# Patient Record
Sex: Female | Born: 1935 | Race: White | Hispanic: No | Smoking: Never smoker
Health system: Southern US, Community
[De-identification: ages and names within clinical notes are randomized; demographics above are authoritative.]

## PROBLEM LIST (undated history)

## (undated) DIAGNOSIS — I4891 Unspecified atrial fibrillation: Secondary | ICD-10-CM

## (undated) DIAGNOSIS — G309 Alzheimer's disease, unspecified: Secondary | ICD-10-CM

## (undated) DIAGNOSIS — I10 Essential (primary) hypertension: Secondary | ICD-10-CM

## (undated) DIAGNOSIS — F0280 Dementia in other diseases classified elsewhere without behavioral disturbance: Secondary | ICD-10-CM

## (undated) HISTORY — PX: CHOLECYSTECTOMY: SHX55

## (undated) HISTORY — PX: ABDOMINAL HYSTERECTOMY: SHX81

## (undated) HISTORY — PX: HIP FRACTURE SURGERY: SHX118

---

## 2004-03-23 ENCOUNTER — Ambulatory Visit: Payer: Self-pay | Admitting: Unknown Physician Specialty

## 2004-04-02 ENCOUNTER — Emergency Department: Payer: Self-pay | Admitting: Unknown Physician Specialty

## 2004-11-30 ENCOUNTER — Ambulatory Visit: Payer: Self-pay | Admitting: Ophthalmology

## 2006-06-15 ENCOUNTER — Ambulatory Visit: Payer: Self-pay | Admitting: Unknown Physician Specialty

## 2007-08-08 ENCOUNTER — Ambulatory Visit: Payer: Self-pay | Admitting: Unknown Physician Specialty

## 2007-09-24 ENCOUNTER — Ambulatory Visit: Payer: Self-pay | Admitting: Unknown Physician Specialty

## 2007-11-12 ENCOUNTER — Ambulatory Visit: Payer: Self-pay | Admitting: Unknown Physician Specialty

## 2007-12-28 ENCOUNTER — Ambulatory Visit: Payer: Self-pay | Admitting: Urology

## 2008-01-02 ENCOUNTER — Ambulatory Visit: Payer: Self-pay | Admitting: Urology

## 2008-05-01 ENCOUNTER — Ambulatory Visit: Payer: Self-pay | Admitting: Urology

## 2008-09-10 ENCOUNTER — Ambulatory Visit: Payer: Self-pay | Admitting: Urology

## 2009-07-13 ENCOUNTER — Ambulatory Visit: Payer: Self-pay | Admitting: Internal Medicine

## 2010-10-01 ENCOUNTER — Inpatient Hospital Stay: Payer: Self-pay | Admitting: Student

## 2010-10-07 LAB — PATHOLOGY REPORT

## 2010-11-03 ENCOUNTER — Ambulatory Visit: Payer: Self-pay | Admitting: Pain Medicine

## 2010-11-09 ENCOUNTER — Inpatient Hospital Stay: Payer: Self-pay | Admitting: *Deleted

## 2010-11-12 LAB — PATHOLOGY REPORT

## 2010-11-22 ENCOUNTER — Ambulatory Visit: Payer: Self-pay | Admitting: Pain Medicine

## 2010-11-30 ENCOUNTER — Ambulatory Visit: Payer: Self-pay | Admitting: Pain Medicine

## 2010-12-02 ENCOUNTER — Ambulatory Visit: Payer: Self-pay | Admitting: Pain Medicine

## 2010-12-15 ENCOUNTER — Ambulatory Visit: Payer: Self-pay | Admitting: Pain Medicine

## 2010-12-16 ENCOUNTER — Ambulatory Visit: Payer: Self-pay | Admitting: Pain Medicine

## 2011-01-03 ENCOUNTER — Ambulatory Visit: Payer: Self-pay | Admitting: Pain Medicine

## 2011-01-17 ENCOUNTER — Emergency Department: Payer: Self-pay | Admitting: Emergency Medicine

## 2011-03-13 ENCOUNTER — Emergency Department: Payer: Self-pay | Admitting: Emergency Medicine

## 2011-03-13 LAB — URINALYSIS, COMPLETE
Bacteria: NONE SEEN
Bilirubin,UR: NEGATIVE
Glucose,UR: NEGATIVE mg/dL (ref 0–75)
Ketone: NEGATIVE
Protein: NEGATIVE
RBC,UR: 1 /HPF (ref 0–5)
Specific Gravity: 1.01 (ref 1.003–1.030)
WBC UR: 1 /HPF (ref 0–5)

## 2011-03-13 LAB — COMPREHENSIVE METABOLIC PANEL
Anion Gap: 8 (ref 7–16)
BUN: 11 mg/dL (ref 7–18)
Bilirubin,Total: 0.6 mg/dL (ref 0.2–1.0)
Calcium, Total: 9.2 mg/dL (ref 8.5–10.1)
Chloride: 98 mmol/L (ref 98–107)
Co2: 32 mmol/L (ref 21–32)
EGFR (African American): >60
EGFR (Non-African Amer.): >60
Osmolality: 275 (ref 275–301)
SGOT(AST): 21 U/L (ref 15–37)
SGPT (ALT): 14 U/L
Total Protein: 7.1 g/dL (ref 6.4–8.2)

## 2011-03-13 LAB — CBC
HCT: 37.7 % (ref 35.0–47.0)
MCHC: 34 g/dL (ref 32.0–36.0)
Platelet: 216 10*3/uL (ref 150–440)
RBC: 4.07 10*6/uL (ref 3.80–5.20)
RDW: 13.2 % (ref 11.5–14.5)
WBC: 7.3 10*3/uL (ref 3.6–11.0)

## 2011-03-14 ENCOUNTER — Ambulatory Visit: Payer: Self-pay | Admitting: Physician Assistant

## 2011-03-14 LAB — URINE CULTURE

## 2011-04-25 ENCOUNTER — Ambulatory Visit: Payer: Self-pay | Admitting: Pain Medicine

## 2011-08-16 ENCOUNTER — Ambulatory Visit: Payer: Self-pay | Admitting: Pain Medicine

## 2011-08-18 ENCOUNTER — Ambulatory Visit: Payer: Self-pay | Admitting: Pain Medicine

## 2011-09-06 ENCOUNTER — Ambulatory Visit: Payer: Self-pay | Admitting: Pain Medicine

## 2011-09-07 ENCOUNTER — Ambulatory Visit: Payer: Self-pay | Admitting: Pain Medicine

## 2011-09-15 ENCOUNTER — Ambulatory Visit: Payer: Self-pay | Admitting: Pain Medicine

## 2011-10-05 ENCOUNTER — Ambulatory Visit: Payer: Self-pay | Admitting: Pain Medicine

## 2011-11-02 ENCOUNTER — Ambulatory Visit: Payer: Self-pay | Admitting: Pain Medicine

## 2012-03-12 ENCOUNTER — Ambulatory Visit: Payer: Self-pay | Admitting: Pain Medicine

## 2013-04-03 ENCOUNTER — Inpatient Hospital Stay: Payer: Self-pay | Admitting: Internal Medicine

## 2013-04-03 LAB — URINALYSIS, COMPLETE
Bacteria: NONE SEEN
Bilirubin,UR: NEGATIVE
Glucose,UR: 50 mg/dL (ref 0–75)
KETONE: NEGATIVE
NITRITE: NEGATIVE
Ph: 5 (ref 4.5–8.0)
Protein: 75
RBC,UR: 13 /HPF (ref 0–5)
Specific Gravity: 1.025 (ref 1.003–1.030)
WBC UR: 62 /HPF (ref 0–5)

## 2013-04-03 LAB — CBC WITH DIFFERENTIAL/PLATELET
Basophil #: 0 10*3/uL (ref 0.0–0.1)
Basophil %: 0.2 %
Eosinophil #: 0 10*3/uL (ref 0.0–0.7)
Eosinophil %: 0.1 %
HCT: 33.8 % — ABNORMAL LOW (ref 35.0–47.0)
HGB: 11.4 g/dL — AB (ref 12.0–16.0)
LYMPHS ABS: 1 10*3/uL (ref 1.0–3.6)
LYMPHS PCT: 3.8 %
MCH: 31.1 pg (ref 26.0–34.0)
MCHC: 33.8 g/dL (ref 32.0–36.0)
MCV: 92 fL (ref 80–100)
MONO ABS: 1.6 x10 3/mm — AB (ref 0.2–0.9)
MONOS PCT: 6.1 %
Neutrophil #: 22.8 10*3/uL — ABNORMAL HIGH (ref 1.4–6.5)
Neutrophil %: 89.8 %
PLATELETS: 234 10*3/uL (ref 150–440)
RBC: 3.68 10*6/uL — ABNORMAL LOW (ref 3.80–5.20)
RDW: 13.5 % (ref 11.5–14.5)
WBC: 25.3 10*3/uL — ABNORMAL HIGH (ref 3.6–11.0)

## 2013-04-03 LAB — BASIC METABOLIC PANEL
Anion Gap: 7 (ref 7–16)
BUN: 17 mg/dL (ref 7–18)
CREATININE: 0.76 mg/dL (ref 0.60–1.30)
Calcium, Total: 8.8 mg/dL (ref 8.5–10.1)
Chloride: 91 mmol/L — ABNORMAL LOW (ref 98–107)
Co2: 29 mmol/L (ref 21–32)
EGFR (African American): >60
Glucose: 154 mg/dL — ABNORMAL HIGH (ref 65–99)
Osmolality: 260 (ref 275–301)
Potassium: 3.6 mmol/L (ref 3.5–5.1)
Sodium: 127 mmol/L — ABNORMAL LOW (ref 136–145)

## 2013-04-03 LAB — TROPONIN I: Troponin-I: <0.02 ng/mL

## 2013-04-04 LAB — CBC WITH DIFFERENTIAL/PLATELET
BASOS ABS: 0 10*3/uL (ref 0.0–0.1)
Basophil %: 0.3 %
Eosinophil #: 0.1 10*3/uL (ref 0.0–0.7)
Eosinophil %: 0.9 %
HCT: 31.1 % — ABNORMAL LOW (ref 35.0–47.0)
HGB: 10.7 g/dL — ABNORMAL LOW (ref 12.0–16.0)
Lymphocyte #: 1.6 10*3/uL (ref 1.0–3.6)
Lymphocyte %: 11.5 %
MCH: 31.4 pg (ref 26.0–34.0)
MCHC: 34.3 g/dL (ref 32.0–36.0)
MCV: 91 fL (ref 80–100)
Monocyte #: 1.2 x10 3/mm — ABNORMAL HIGH (ref 0.2–0.9)
Monocyte %: 8.7 %
NEUTROS PCT: 78.6 %
Neutrophil #: 10.8 10*3/uL — ABNORMAL HIGH (ref 1.4–6.5)
PLATELETS: 247 10*3/uL (ref 150–440)
RBC: 3.41 10*6/uL — ABNORMAL LOW (ref 3.80–5.20)
RDW: 13.6 % (ref 11.5–14.5)
WBC: 13.7 10*3/uL — ABNORMAL HIGH (ref 3.6–11.0)

## 2013-04-04 LAB — BASIC METABOLIC PANEL
ANION GAP: 3 — AB (ref 7–16)
BUN: 17 mg/dL (ref 7–18)
CO2: 33 mmol/L — AB (ref 21–32)
Calcium, Total: 8.7 mg/dL (ref 8.5–10.1)
Chloride: 93 mmol/L — ABNORMAL LOW (ref 98–107)
Creatinine: 0.68 mg/dL (ref 0.60–1.30)
EGFR (African American): >60
EGFR (Non-African Amer.): >60
Glucose: 99 mg/dL (ref 65–99)
Osmolality: 261 (ref 275–301)
POTASSIUM: 3.2 mmol/L — AB (ref 3.5–5.1)
SODIUM: 129 mmol/L — AB (ref 136–145)

## 2013-04-04 LAB — MAGNESIUM: MAGNESIUM: 1.9 mg/dL

## 2013-04-05 LAB — BASIC METABOLIC PANEL
ANION GAP: 2 — AB (ref 7–16)
BUN: 12 mg/dL (ref 7–18)
CALCIUM: 9.1 mg/dL (ref 8.5–10.1)
CHLORIDE: 99 mmol/L (ref 98–107)
Co2: 32 mmol/L (ref 21–32)
Creatinine: 0.67 mg/dL (ref 0.60–1.30)
EGFR (Non-African Amer.): >60
Glucose: 91 mg/dL (ref 65–99)
Osmolality: 266 (ref 275–301)
POTASSIUM: 3.9 mmol/L (ref 3.5–5.1)
Sodium: 133 mmol/L — ABNORMAL LOW (ref 136–145)

## 2013-04-05 LAB — CBC WITH DIFFERENTIAL/PLATELET
BASOS PCT: 0.7 %
Basophil #: 0 10*3/uL (ref 0.0–0.1)
EOS PCT: 3.3 %
Eosinophil #: 0.2 10*3/uL (ref 0.0–0.7)
HCT: 34.1 % — AB (ref 35.0–47.0)
HGB: 11.9 g/dL — AB (ref 12.0–16.0)
LYMPHS ABS: 1.3 10*3/uL (ref 1.0–3.6)
LYMPHS PCT: 19.5 %
MCH: 32.2 pg (ref 26.0–34.0)
MCHC: 34.9 g/dL (ref 32.0–36.0)
MCV: 92 fL (ref 80–100)
MONOS PCT: 10.3 %
Monocyte #: 0.7 x10 3/mm (ref 0.2–0.9)
Neutrophil #: 4.5 10*3/uL (ref 1.4–6.5)
Neutrophil %: 66.2 %
Platelet: 296 10*3/uL (ref 150–440)
RBC: 3.69 10*6/uL — ABNORMAL LOW (ref 3.80–5.20)
RDW: 13.6 % (ref 11.5–14.5)
WBC: 6.8 10*3/uL (ref 3.6–11.0)

## 2013-04-05 LAB — URINE CULTURE

## 2013-04-08 LAB — CULTURE, BLOOD (SINGLE)

## 2013-04-10 ENCOUNTER — Inpatient Hospital Stay: Payer: Self-pay | Admitting: Family Medicine

## 2013-04-10 LAB — BASIC METABOLIC PANEL
ANION GAP: 8 (ref 7–16)
BUN: 10 mg/dL (ref 7–18)
Calcium, Total: 9.4 mg/dL (ref 8.5–10.1)
Chloride: 96 mmol/L — ABNORMAL LOW (ref 98–107)
Co2: 29 mmol/L (ref 21–32)
Creatinine: 0.86 mg/dL (ref 0.60–1.30)
EGFR (African American): >60
Glucose: 131 mg/dL — ABNORMAL HIGH (ref 65–99)
Osmolality: 267 (ref 275–301)
Potassium: 3.9 mmol/L (ref 3.5–5.1)
SODIUM: 133 mmol/L — AB (ref 136–145)

## 2013-04-10 LAB — URINALYSIS, COMPLETE
BACTERIA: NONE SEEN
BILIRUBIN, UR: NEGATIVE
BLOOD: NEGATIVE
Glucose,UR: NEGATIVE mg/dL (ref 0–75)
Hyaline Cast: 4
KETONE: NEGATIVE
Leukocyte Esterase: NEGATIVE
NITRITE: NEGATIVE
PH: 6 (ref 4.5–8.0)
Protein: 30
RBC,UR: 1 /HPF (ref 0–5)
SPECIFIC GRAVITY: 1.017 (ref 1.003–1.030)
Squamous Epithelial: <1
WBC UR: 2 /HPF (ref 0–5)

## 2013-04-10 LAB — CBC
HCT: 36.9 % (ref 35.0–47.0)
HGB: 12.2 g/dL (ref 12.0–16.0)
MCH: 30.4 pg (ref 26.0–34.0)
MCHC: 33 g/dL (ref 32.0–36.0)
MCV: 92 fL (ref 80–100)
Platelet: 381 10*3/uL (ref 150–440)
RBC: 4.01 10*6/uL (ref 3.80–5.20)
RDW: 13.6 % (ref 11.5–14.5)
WBC: 9.3 10*3/uL (ref 3.6–11.0)

## 2013-04-10 LAB — PROTIME-INR
INR: 1
PROTHROMBIN TIME: 13.3 s (ref 11.5–14.7)

## 2013-04-10 LAB — APTT: Activated PTT: 33.6 secs (ref 23.6–35.9)

## 2013-04-11 LAB — CBC WITH DIFFERENTIAL/PLATELET
BASOS ABS: 0.1 10*3/uL (ref 0.0–0.1)
BASOS PCT: 0.6 %
EOS ABS: 0.1 10*3/uL (ref 0.0–0.7)
EOS PCT: 1 %
HCT: 33.1 % — AB (ref 35.0–47.0)
HGB: 11.1 g/dL — AB (ref 12.0–16.0)
Lymphocyte #: 1.1 10*3/uL (ref 1.0–3.6)
Lymphocyte %: 7.5 %
MCH: 30.9 pg (ref 26.0–34.0)
MCHC: 33.7 g/dL (ref 32.0–36.0)
MCV: 92 fL (ref 80–100)
Monocyte #: 1.1 x10 3/mm — ABNORMAL HIGH (ref 0.2–0.9)
Monocyte %: 7.6 %
Neutrophil #: 11.7 10*3/uL — ABNORMAL HIGH (ref 1.4–6.5)
Neutrophil %: 83.3 %
Platelet: 313 10*3/uL (ref 150–440)
RBC: 3.61 10*6/uL — ABNORMAL LOW (ref 3.80–5.20)
RDW: 13.4 % (ref 11.5–14.5)
WBC: 14 10*3/uL — AB (ref 3.6–11.0)

## 2013-04-11 LAB — BASIC METABOLIC PANEL
Anion Gap: 7 (ref 7–16)
BUN: 9 mg/dL (ref 7–18)
CALCIUM: 9 mg/dL (ref 8.5–10.1)
CHLORIDE: 99 mmol/L (ref 98–107)
CO2: 29 mmol/L (ref 21–32)
CREATININE: 0.78 mg/dL (ref 0.60–1.30)
EGFR (Non-African Amer.): >60
Glucose: 121 mg/dL — ABNORMAL HIGH (ref 65–99)
Osmolality: 270 (ref 275–301)
POTASSIUM: 3.8 mmol/L (ref 3.5–5.1)
SODIUM: 135 mmol/L — AB (ref 136–145)

## 2013-04-12 LAB — BASIC METABOLIC PANEL
Anion Gap: 4 — ABNORMAL LOW (ref 7–16)
BUN: 8 mg/dL (ref 7–18)
CALCIUM: 8.8 mg/dL (ref 8.5–10.1)
CREATININE: 0.7 mg/dL (ref 0.60–1.30)
Chloride: 101 mmol/L (ref 98–107)
Co2: 29 mmol/L (ref 21–32)
Glucose: 103 mg/dL — ABNORMAL HIGH (ref 65–99)
OSMOLALITY: 267 (ref 275–301)
Potassium: 4 mmol/L (ref 3.5–5.1)
Sodium: 134 mmol/L — ABNORMAL LOW (ref 136–145)

## 2013-04-12 LAB — CBC WITH DIFFERENTIAL/PLATELET
BASOS ABS: 0.1 10*3/uL (ref 0.0–0.1)
Basophil %: 0.5 %
EOS PCT: 2.7 %
Eosinophil #: 0.4 10*3/uL (ref 0.0–0.7)
HCT: 33.3 % — ABNORMAL LOW (ref 35.0–47.0)
HGB: 10.8 g/dL — ABNORMAL LOW (ref 12.0–16.0)
LYMPHS ABS: 1.1 10*3/uL (ref 1.0–3.6)
LYMPHS PCT: 8 %
MCH: 30.1 pg (ref 26.0–34.0)
MCHC: 32.4 g/dL (ref 32.0–36.0)
MCV: 93 fL (ref 80–100)
Monocyte #: 0.9 x10 3/mm (ref 0.2–0.9)
Monocyte %: 6.5 %
Neutrophil #: 11.2 10*3/uL — ABNORMAL HIGH (ref 1.4–6.5)
Neutrophil %: 82.3 %
Platelet: 250 10*3/uL (ref 150–440)
RBC: 3.58 10*6/uL — AB (ref 3.80–5.20)
RDW: 13.8 % (ref 11.5–14.5)
WBC: 13.6 10*3/uL — ABNORMAL HIGH (ref 3.6–11.0)

## 2013-04-13 LAB — CBC WITH DIFFERENTIAL/PLATELET
Basophil #: 0.1 10*3/uL (ref 0.0–0.1)
Basophil %: 0.6 %
Eosinophil #: 0.4 10*3/uL (ref 0.0–0.7)
Eosinophil %: 3.9 %
HCT: 32 % — AB (ref 35.0–47.0)
HGB: 10.7 g/dL — AB (ref 12.0–16.0)
LYMPHS ABS: 1.3 10*3/uL (ref 1.0–3.6)
LYMPHS PCT: 13.1 %
MCH: 30.9 pg (ref 26.0–34.0)
MCHC: 33.4 g/dL (ref 32.0–36.0)
MCV: 92 fL (ref 80–100)
MONO ABS: 0.6 x10 3/mm (ref 0.2–0.9)
Monocyte %: 6.5 %
Neutrophil #: 7.6 10*3/uL — ABNORMAL HIGH (ref 1.4–6.5)
Neutrophil %: 75.9 %
Platelet: 239 10*3/uL (ref 150–440)
RBC: 3.46 10*6/uL — ABNORMAL LOW (ref 3.80–5.20)
RDW: 13.8 % (ref 11.5–14.5)
WBC: 10 10*3/uL (ref 3.6–11.0)

## 2013-04-14 LAB — CBC WITH DIFFERENTIAL/PLATELET
Basophil #: 0.1 10*3/uL (ref 0.0–0.1)
Basophil %: 0.6 %
EOS ABS: 0.4 10*3/uL (ref 0.0–0.7)
Eosinophil %: 3.4 %
HCT: 31.2 % — ABNORMAL LOW (ref 35.0–47.0)
HGB: 10.7 g/dL — AB (ref 12.0–16.0)
Lymphocyte #: 1.1 10*3/uL (ref 1.0–3.6)
Lymphocyte %: 8.8 %
MCH: 31.5 pg (ref 26.0–34.0)
MCHC: 34.4 g/dL (ref 32.0–36.0)
MCV: 91 fL (ref 80–100)
MONOS PCT: 6.2 %
Monocyte #: 0.8 x10 3/mm (ref 0.2–0.9)
Neutrophil #: 10.4 10*3/uL — ABNORMAL HIGH (ref 1.4–6.5)
Neutrophil %: 81 %
Platelet: 243 10*3/uL (ref 150–440)
RBC: 3.42 10*6/uL — ABNORMAL LOW (ref 3.80–5.20)
RDW: 13.7 % (ref 11.5–14.5)
WBC: 12.9 10*3/uL — AB (ref 3.6–11.0)

## 2013-04-16 ENCOUNTER — Emergency Department: Payer: Self-pay | Admitting: Emergency Medicine

## 2013-04-16 DIAGNOSIS — I10 Essential (primary) hypertension: Secondary | ICD-10-CM

## 2013-04-16 DIAGNOSIS — F0150 Vascular dementia without behavioral disturbance: Secondary | ICD-10-CM

## 2013-04-16 DIAGNOSIS — S72009A Fracture of unspecified part of neck of unspecified femur, initial encounter for closed fracture: Secondary | ICD-10-CM

## 2013-04-16 DIAGNOSIS — I4891 Unspecified atrial fibrillation: Secondary | ICD-10-CM

## 2013-04-16 LAB — BASIC METABOLIC PANEL
Anion Gap: 8 (ref 7–16)
BUN: 17 mg/dL (ref 7–18)
CO2: 26 mmol/L (ref 21–32)
Calcium, Total: 9.4 mg/dL (ref 8.5–10.1)
Chloride: 102 mmol/L (ref 98–107)
Creatinine: 0.59 mg/dL — ABNORMAL LOW (ref 0.60–1.30)
EGFR (African American): >60
EGFR (Non-African Amer.): >60
Glucose: 117 mg/dL — ABNORMAL HIGH (ref 65–99)
Osmolality: 275 (ref 275–301)
Potassium: 3.7 mmol/L (ref 3.5–5.1)
SODIUM: 136 mmol/L (ref 136–145)

## 2013-04-16 LAB — CBC
HCT: 36.7 % (ref 35.0–47.0)
HGB: 12 g/dL (ref 12.0–16.0)
MCH: 29.9 pg (ref 26.0–34.0)
MCHC: 32.6 g/dL (ref 32.0–36.0)
MCV: 92 fL (ref 80–100)
PLATELETS: 403 10*3/uL (ref 150–440)
RBC: 4.01 10*6/uL (ref 3.80–5.20)
RDW: 13.8 % (ref 11.5–14.5)
WBC: 12.2 10*3/uL — ABNORMAL HIGH (ref 3.6–11.0)

## 2013-04-16 LAB — TROPONIN I: Troponin-I: <0.02 ng/mL

## 2013-04-17 LAB — TROPONIN I

## 2013-05-14 DIAGNOSIS — I4891 Unspecified atrial fibrillation: Secondary | ICD-10-CM

## 2013-05-14 DIAGNOSIS — I10 Essential (primary) hypertension: Secondary | ICD-10-CM

## 2013-05-14 DIAGNOSIS — S72009A Fracture of unspecified part of neck of unspecified femur, initial encounter for closed fracture: Secondary | ICD-10-CM

## 2013-05-14 DIAGNOSIS — F0280 Dementia in other diseases classified elsewhere without behavioral disturbance: Secondary | ICD-10-CM

## 2013-05-14 DIAGNOSIS — G309 Alzheimer's disease, unspecified: Secondary | ICD-10-CM

## 2013-05-17 DIAGNOSIS — F3289 Other specified depressive episodes: Secondary | ICD-10-CM

## 2013-05-17 DIAGNOSIS — F411 Generalized anxiety disorder: Secondary | ICD-10-CM

## 2013-05-17 DIAGNOSIS — F329 Major depressive disorder, single episode, unspecified: Secondary | ICD-10-CM

## 2013-05-17 DIAGNOSIS — G309 Alzheimer's disease, unspecified: Secondary | ICD-10-CM

## 2013-05-17 DIAGNOSIS — F0280 Dementia in other diseases classified elsewhere without behavioral disturbance: Secondary | ICD-10-CM

## 2013-05-29 DIAGNOSIS — F0280 Dementia in other diseases classified elsewhere without behavioral disturbance: Secondary | ICD-10-CM

## 2013-05-29 DIAGNOSIS — F329 Major depressive disorder, single episode, unspecified: Secondary | ICD-10-CM

## 2013-05-29 DIAGNOSIS — F3289 Other specified depressive episodes: Secondary | ICD-10-CM

## 2013-05-29 DIAGNOSIS — G309 Alzheimer's disease, unspecified: Secondary | ICD-10-CM

## 2013-06-05 DIAGNOSIS — F3289 Other specified depressive episodes: Secondary | ICD-10-CM

## 2013-06-05 DIAGNOSIS — I10 Essential (primary) hypertension: Secondary | ICD-10-CM

## 2013-06-05 DIAGNOSIS — S7290XA Unspecified fracture of unspecified femur, initial encounter for closed fracture: Secondary | ICD-10-CM

## 2013-06-05 DIAGNOSIS — N052 Unspecified nephritic syndrome with diffuse membranous glomerulonephritis: Secondary | ICD-10-CM

## 2013-06-05 DIAGNOSIS — M1710 Unilateral primary osteoarthritis, unspecified knee: Secondary | ICD-10-CM

## 2013-06-05 DIAGNOSIS — IMO0002 Reserved for concepts with insufficient information to code with codable children: Secondary | ICD-10-CM

## 2013-06-05 DIAGNOSIS — F0280 Dementia in other diseases classified elsewhere without behavioral disturbance: Secondary | ICD-10-CM

## 2013-06-05 DIAGNOSIS — I4891 Unspecified atrial fibrillation: Secondary | ICD-10-CM

## 2013-06-05 DIAGNOSIS — G309 Alzheimer's disease, unspecified: Secondary | ICD-10-CM

## 2013-06-05 DIAGNOSIS — H6120 Impacted cerumen, unspecified ear: Secondary | ICD-10-CM

## 2013-06-05 DIAGNOSIS — F329 Major depressive disorder, single episode, unspecified: Secondary | ICD-10-CM

## 2013-07-11 DIAGNOSIS — S72019A Unspecified intracapsular fracture of unspecified femur, initial encounter for closed fracture: Secondary | ICD-10-CM

## 2013-07-11 DIAGNOSIS — F0280 Dementia in other diseases classified elsewhere without behavioral disturbance: Secondary | ICD-10-CM

## 2013-07-11 DIAGNOSIS — K219 Gastro-esophageal reflux disease without esophagitis: Secondary | ICD-10-CM

## 2013-07-11 DIAGNOSIS — I10 Essential (primary) hypertension: Secondary | ICD-10-CM | POA: Diagnosis not present

## 2013-07-11 DIAGNOSIS — G309 Alzheimer's disease, unspecified: Secondary | ICD-10-CM

## 2013-07-11 DIAGNOSIS — I4891 Unspecified atrial fibrillation: Secondary | ICD-10-CM

## 2013-09-03 ENCOUNTER — Ambulatory Visit: Payer: Self-pay | Admitting: Internal Medicine

## 2013-09-11 DIAGNOSIS — I10 Essential (primary) hypertension: Secondary | ICD-10-CM

## 2013-09-11 DIAGNOSIS — N052 Unspecified nephritic syndrome with diffuse membranous glomerulonephritis: Secondary | ICD-10-CM

## 2013-09-11 DIAGNOSIS — I4891 Unspecified atrial fibrillation: Secondary | ICD-10-CM

## 2013-09-11 DIAGNOSIS — G309 Alzheimer's disease, unspecified: Secondary | ICD-10-CM

## 2013-09-11 DIAGNOSIS — F0280 Dementia in other diseases classified elsewhere without behavioral disturbance: Secondary | ICD-10-CM

## 2013-09-11 DIAGNOSIS — R635 Abnormal weight gain: Secondary | ICD-10-CM

## 2013-09-11 DIAGNOSIS — S7290XA Unspecified fracture of unspecified femur, initial encounter for closed fracture: Secondary | ICD-10-CM

## 2013-11-25 DIAGNOSIS — F0280 Dementia in other diseases classified elsewhere without behavioral disturbance: Secondary | ICD-10-CM

## 2013-11-25 DIAGNOSIS — G309 Alzheimer's disease, unspecified: Secondary | ICD-10-CM

## 2013-11-25 DIAGNOSIS — I10 Essential (primary) hypertension: Secondary | ICD-10-CM | POA: Diagnosis not present

## 2013-11-25 DIAGNOSIS — I4891 Unspecified atrial fibrillation: Secondary | ICD-10-CM | POA: Diagnosis not present

## 2013-11-25 DIAGNOSIS — S7290XA Unspecified fracture of unspecified femur, initial encounter for closed fracture: Secondary | ICD-10-CM

## 2013-11-25 DIAGNOSIS — IMO0002 Reserved for concepts with insufficient information to code with codable children: Secondary | ICD-10-CM

## 2014-01-08 DIAGNOSIS — I10 Essential (primary) hypertension: Secondary | ICD-10-CM | POA: Diagnosis not present

## 2014-01-08 DIAGNOSIS — S7291XA Unspecified fracture of right femur, initial encounter for closed fracture: Secondary | ICD-10-CM

## 2014-01-08 DIAGNOSIS — K219 Gastro-esophageal reflux disease without esophagitis: Secondary | ICD-10-CM

## 2014-01-08 DIAGNOSIS — G309 Alzheimer's disease, unspecified: Secondary | ICD-10-CM

## 2014-01-08 DIAGNOSIS — I4891 Unspecified atrial fibrillation: Secondary | ICD-10-CM

## 2014-01-08 DIAGNOSIS — M1990 Unspecified osteoarthritis, unspecified site: Secondary | ICD-10-CM

## 2014-01-08 DIAGNOSIS — F323 Major depressive disorder, single episode, severe with psychotic features: Secondary | ICD-10-CM

## 2014-03-25 DIAGNOSIS — F0281 Dementia in other diseases classified elsewhere with behavioral disturbance: Secondary | ICD-10-CM

## 2014-03-25 DIAGNOSIS — R451 Restlessness and agitation: Secondary | ICD-10-CM

## 2014-03-25 DIAGNOSIS — G309 Alzheimer's disease, unspecified: Secondary | ICD-10-CM

## 2014-03-25 DIAGNOSIS — L03012 Cellulitis of left finger: Secondary | ICD-10-CM

## 2014-03-25 DIAGNOSIS — I10 Essential (primary) hypertension: Secondary | ICD-10-CM

## 2014-03-25 DIAGNOSIS — K219 Gastro-esophageal reflux disease without esophagitis: Secondary | ICD-10-CM

## 2014-03-25 DIAGNOSIS — I480 Paroxysmal atrial fibrillation: Secondary | ICD-10-CM | POA: Diagnosis not present

## 2014-05-14 DIAGNOSIS — G309 Alzheimer's disease, unspecified: Secondary | ICD-10-CM

## 2014-05-14 DIAGNOSIS — R451 Restlessness and agitation: Secondary | ICD-10-CM | POA: Diagnosis not present

## 2014-05-14 DIAGNOSIS — K219 Gastro-esophageal reflux disease without esophagitis: Secondary | ICD-10-CM

## 2014-05-14 DIAGNOSIS — I4891 Unspecified atrial fibrillation: Secondary | ICD-10-CM

## 2014-05-14 DIAGNOSIS — I10 Essential (primary) hypertension: Secondary | ICD-10-CM | POA: Diagnosis not present

## 2014-06-03 DIAGNOSIS — J069 Acute upper respiratory infection, unspecified: Secondary | ICD-10-CM | POA: Diagnosis not present

## 2014-06-13 DIAGNOSIS — R6884 Jaw pain: Secondary | ICD-10-CM

## 2014-06-21 NOTE — Consult Note (Signed)
Brief Consult Note: Diagnosis: Right lung mass/consolidation.   Comments: At present CT findings with R lung consolidation with air bronchogram possibly pneumonia.. Would repeat CT Chest in 6 weeks to determine further workup. PET CT not helpful at this time as would not differentiate infection from malignancy. At present more acute issues of UTI and mental status changes. Will follow with CT Chest in 6 weeks.  Electronic Signatures: Georges Mouse (MD)  (Signed 05-Feb-15 14:57)  Authored: Brief Consult Note   Last Updated: 05-Feb-15 14:57 by Georges Mouse (MD)
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
## 2014-06-21 NOTE — Discharge Summary (Signed)
PATIENT NAME:  Brandy Shepard, Brandy Shepard MR#:  [id number] DATE OF BIRTH:  [birth year]  DATE OF ADMISSION:  04/03/2013 DATE OF DISCHARGE:  04/05/2013   ADDENDUM  The patient's CAT scan of chest showed possible lung mass. We requested oncology consult. Dr. Kallie Edward  suggested the patient may have pneumonia, but needed treating with antibiotics and then followup with a CAT scan of the chest in 6 weeks. The patient will be discharged with antibiotics, Augmentin for 7 days, then the patient needs to follow up with PCP to repeat CAT scan of chest or chest x-ray.   ____________________________ Demetrios Loll, MD qc:ce D: 04/05/2013 15:58:40 ET T: 04/05/2013 19:10:26 ET JOB#: [id number]  cc: Demetrios Loll, MD, <Dictator> Demetrios Loll MD ELECTRONICALLY SIGNED 04/09/2013 10:19
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
## 2014-06-21 NOTE — Discharge Summary (Signed)
PATIENT NAME:  Brandy Shepard, Brandy Shepard MR#:  [id number] DATE OF BIRTH:  [birth year]  DATE OF ADMISSION:  04/03/2013 DATE OF DISCHARGE:  04/05/2013  REFERRING PHYSICIAN: Dr. Alphonse Guild.     DISCHARGE DIAGNOSES: 1.  Acute metabolic encephalopathy due to urinary tract infection.  2.  Lung mass with questionable pneumonia.  3.  Hypertension.  4.  Hyponatremia.  5.  Vascular dementia.   CONDITION: Stable.   CODE STATUS: FULL CODE.   HOME MEDICATIONS: Please refer to the [facility] physician discharge instruction medication reconciliation list.   DIET: Low-sodium diet.   ACTIVITY: As tolerated.   FOLLOWUP CARE: Follow up with PCP within 1 to 2 weeks. Also, the patient needs a repeat  chest x-ray or CAT scan of chest with PCP in 6 weeks for lung mass.   REASON FOR ADMISSION: Altered mental status and agitation.   HOSPITAL COURSE: The patient is an 81 year old Caucasian female with a history of vascular dementia, hypertension, GERD, PUD, hyponatremia, was sent from assisted living due to altered mental status and agitation. In the ED, the patient was noted to have a UTI with leukocytosis. For detailed history and physical examination, please refer to the admission note dictated by Dr. Verdell Carmine. On admission date, CAT scan of head did not show any acute intracranial process.  BUN 17, creatinine 0.7, potassium 3.6, sodium 127, bicarb 29. Urinalysis showed WBC of 62. Troponin less than 0.02. CBC showed WBC 25.3, hemoglobin 11.4.  1.  Altered mental status and agitation, possibly due to acute metabolic encephalopathy due to urinary tract infection. The patient's mental status has improved to her baseline after antibiotic treatment and IV fluid support.  2.  Urinary tract infection. The patient has been treated with Rocephin after admission. Blood culture has been negative. Urine culture is pending. The patient's symptoms have much improved.  3.  Hypertension has been controlled with Norvasc and bisoprolol.  4.   Hypokalemia, was treated with potassium supplement, has improved.  5.  Hyponatremia. The patient has been treated with normal saline IV. Sodium increased to 133 today.  6.  Discussed the patient's discharge plan with the patient's daughter, nurse and case manager. The patient will be discharged back to assisted living today.   TIME SPENT: About 36 minutes.   ____________________________ Demetrios Loll, MD qc:dmm D: 04/05/2013 15:56:39 ET T: 04/05/2013 19:34:13 ET JOB#: [id number]  cc: Demetrios Loll, MD, <Dictator> Demetrios Loll MD ELECTRONICALLY SIGNED 04/09/2013 10:20

## 2014-06-21 NOTE — H&P (Signed)
PATIENT NAME:  Brandy Shepard, Brandy Shepard MR#:  [id number] DATE OF BIRTH:  08-26-1926  DATE OF ADMISSION:  04/03/2013  PRIMARY CARE PHYSICIAN: Olean Ree [place], [place]  CHIEF COMPLAINT: Altered mental status and agitation.   HISTORY OF PRESENT ILLNESS: This is an 81 year old female who presents to the hospital from [facility] due to altered mental status and agitation. The patient apparently had a fall the day before yesterday, has a bruise around her right eye. This morning, the nursing staff at the facility noted her to be more agitated and confused. They sent her over to the ER for further evaluation. In the Emergency Room, the patient was noted to have a urinary tract infection with leukocytosis. The patient denies any urinary symptoms and denies any upper respiratory symptoms presently. She was afebrile, although given her UTI and her acute change in mental status, hospitalist services were contacted for further treatment and evaluation.   REVIEW OF SYSTEMS:  CONSTITUTIONAL: No documented fever. No weight gain, no weight loss.  EYES: No blurred or double vision.  ENT: No tinnitus. No postnasal drip. No redness of the oropharynx.  RESPIRATORY: No cough, no wheeze, no hemoptysis, no dyspnea.  CARDIOVASCULAR: No chest pain, no orthopnea, no palpitations or syncope.  GASTROINTESTINAL: No nausea, no vomiting or diarrhea. No abdominal pain. No melena or hematochezia.  GENITOURINARY: No dysuria, no hematuria.  ENDOCRINE: No polyuria or nocturia, heat or cold intolerance.  HEMATOLOGIC: No anemia, no bruising, no bleeding.  INTEGUMENT: No rashes or lesions.  MUSCULOSKELETAL: No arthritis. No swelling. No gout.  NEUROLOGIC: No numbness or tingling. No ataxia. No seizure-type activity. PSYCHIATRIC: No anxiety. No insomnia. No ADD.   PAST MEDICAL HISTORY: Consistent with:  1. Vascular dementia.  2. Hypertension.  3. GERD.  4. Peptic ulcer disease.  5. Hyponatremia.   ALLERGIES:  CODEINE, WHICH CAUSES NAUSEA AND VOMITING.   SOCIAL HISTORY: No smoking. No alcohol abuse. No illicit drug abuse. Resides at [facility].   FAMILY HISTORY: Both mother and father are deceased. Mother died from dementia. Father died from complications of heart disease.   CURRENT MEDICATIONS: As follows:  1. Abreva 10% topical cream to be applied q.8 hours as needed.  2. Amlodipine 5 mg daily.  3. Artificial tears daily.  4. Aspirin 81 mg daily.  5. Bisoprolol 5 mg daily. 6. Colace 100 mg b.i.d. 7. Cymbalta 60 mg daily. 8. Aricept 10 mg daily. 9. Duragesic patch 25 mcg q.72 hours. 10. Iron sulfate 325 mg daily. 11. Tylenol 500 mg q.6 hours as needed.  12. Meclizine 25 mg daily as needed.  13. Milk of Magnesia 30 mL daily as needed. 14. MiraLax daily as needed. 15. Neurontin 100 mg at bedtime.  16. Oxycodone 5 mg 1/2 tab q.6 hours as needed. 17. Protonix 40 mg b.i.d. 18. Seroquel 25 mg at bedtime.   PHYSICAL EXAMINATION: Presently, is as follows:  VITAL SIGNS: Noted to be: Temperature is 98.9, pulse 83, respirations 18, blood pressure 116/56, saturation is 92% on room air.  GENERAL: She is a pleasant-appearing female in no apparent distress.  HEAD, EYES, EARS, NOSE AND THROAT: The patient does have a bruise around her right eye; otherwise, normocephalic. Extraocular muscles are intact. Pupils equal and reactive to light. Sclerae anicteric. No conjunctival injection. No pharyngeal erythema.  NECK: Supple. There is no jugular venous distention. No bruits. No lymphadenopathy or thyromegaly.  HEART: Regular rate and rhythm. No murmurs, no rubs, no clicks.  LUNGS: Clear to auscultation  bilaterally. No rales or rhonchi. No wheezes.  ABDOMEN: Soft, flat, nontender, nondistended. Has good bowel sounds. No hepatosplenomegaly appreciated.  EXTREMITIES: No evidence of any cyanosis, clubbing or peripheral edema. Has +2 pedal and radial pulses bilaterally.  NEUROLOGICAL: The  patient is alert, awake and oriented x1. Moves all extremities spontaneously. No other focal motor or sensory deficits appreciated bilaterally.  SKIN: Moist and warm with no rashes appreciated.  LYMPHATIC: There is no cervical or axillary lymphadenopathy.   LABORATORY EXAMINATION: Showed a serum glucose of 154, BUN 17, creatinine 0.7, sodium 127, potassium 3.6, chloride 91, bicarbonate 29. Troponin less than 0.02. White cell count 25.3, hemoglobin 11.4, hematocrit 33.8, platelet count of 234. Urinalysis shows 2+ leukocyte esterase with 62 white cells.   The patient did have a chest x-ray done which showed COPD changes with right middle lobe masslike opacity, could represent infiltrate or tumor.   A CT of the head done without contrast which showed no evidence of any acute intracranial process.   ASSESSMENT AND PLAN: This is an 81 year old female with a history of vascular dementia, hypertension, gastroesophageal reflux disease, history of peptic ulcer disease, hyponatremia, who presents to the hospital due to altered mental status and agitation.   1. Altered mental status/agitation. This is likely related to her underlying dementia complicated with metabolic encephalopathy due to the urinary tract infection. I will give her IV ceftriaxone for the urinary tract infection and follow her mental status. A CT head is negative, showed no acute neurologic problems presently.  2. Urinary tract infection, likely the cause of the patient's mental status change and encephalopathy. I will treat her with IV ceftriaxone, follow urine cultures.  3. Abnormal chest x-ray. The patient noted to have a right middle lobe mass/infiltrate. She has no acute respiratory symptoms. She has no history of tobacco abuse. I will get a CT chest to further evaluate whether this is a mass versus pneumonia.  4. Hypertension. The patient is presently hemodynamically stable. Continue with her Norvasc and bisoprolol.  5. Gastroesophageal  reflux disease/peptic ulcer disease. Continue with her Protonix. 6. Leukocytosis. This is likely related to the urinary tract infection. I will follow with IV antibiotic therapy.  7. Hyponatremia. This is likely chronic for the patient and related to poor p.o. intake. I will hydrate her with IV fluids, follow her sodium.  8. Vascular dementia. Continue with her Aricept and continue with her Seroquel at bedtime.   CODE STATUS: The patient is a full code.   TIME SPENT: 50 minutes.  ____________________________ Belia Heman. Verdell Carmine, MD vjs:lb D: 04/03/2013 11:06:53 ET T: 04/03/2013 11:30:50 ET JOB#: [id number]  cc: Belia Heman. Verdell Carmine, MD, <Dictator> Henreitta Leber MD ELECTRONICALLY SIGNED 04/04/2013 11:52
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
## 2014-07-08 DIAGNOSIS — I10 Essential (primary) hypertension: Secondary | ICD-10-CM

## 2014-07-08 DIAGNOSIS — G301 Alzheimer's disease with late onset: Secondary | ICD-10-CM

## 2014-07-08 DIAGNOSIS — K219 Gastro-esophageal reflux disease without esophagitis: Secondary | ICD-10-CM

## 2014-07-08 DIAGNOSIS — R451 Restlessness and agitation: Secondary | ICD-10-CM

## 2014-07-08 DIAGNOSIS — I480 Paroxysmal atrial fibrillation: Secondary | ICD-10-CM | POA: Diagnosis not present

## 2014-09-10 DIAGNOSIS — I4891 Unspecified atrial fibrillation: Secondary | ICD-10-CM | POA: Diagnosis not present

## 2014-09-10 DIAGNOSIS — K219 Gastro-esophageal reflux disease without esophagitis: Secondary | ICD-10-CM | POA: Diagnosis not present

## 2014-09-10 DIAGNOSIS — G309 Alzheimer's disease, unspecified: Secondary | ICD-10-CM

## 2014-09-10 DIAGNOSIS — I10 Essential (primary) hypertension: Secondary | ICD-10-CM | POA: Diagnosis not present

## 2014-09-10 DIAGNOSIS — R451 Restlessness and agitation: Secondary | ICD-10-CM

## 2014-10-24 DIAGNOSIS — B351 Tinea unguium: Secondary | ICD-10-CM | POA: Diagnosis not present

## 2014-11-06 DIAGNOSIS — G309 Alzheimer's disease, unspecified: Secondary | ICD-10-CM

## 2014-11-06 DIAGNOSIS — K219 Gastro-esophageal reflux disease without esophagitis: Secondary | ICD-10-CM | POA: Diagnosis not present

## 2014-11-06 DIAGNOSIS — R451 Restlessness and agitation: Secondary | ICD-10-CM

## 2014-11-06 DIAGNOSIS — I480 Paroxysmal atrial fibrillation: Secondary | ICD-10-CM | POA: Diagnosis not present

## 2014-11-24 DIAGNOSIS — K130 Diseases of lips: Secondary | ICD-10-CM

## 2014-12-10 IMAGING — CT CT ANGIO CHEST
2 of 6 series · 18 of 36 positions shown · IV contrast (APPLIED)
Comparison: DG CHEST 1V PORT dated 04/16/2013; CT CHEST W/ CM dated
04/03/2013

CLINICAL DATA: Chest pain, mid back pain.

EXAM:
CT ANGIOGRAPHY CHEST WITH CONTRAST
TECHNIQUE: Multidetector CT imaging of the chest was performed using the
standard protocol during bolus administration of intravenous
contrast. Multiplanar CT image reconstructions and MIPs were
obtained to evaluate the vascular anatomy.
CONTRAST:  100 cc Isovue 370 IV.

[Series 5: pe 1.0 thins · axial · 0.60mm/px · z∈[-764,-506]mm · 17 of 292 slices shown]
[im 17/292  lung]
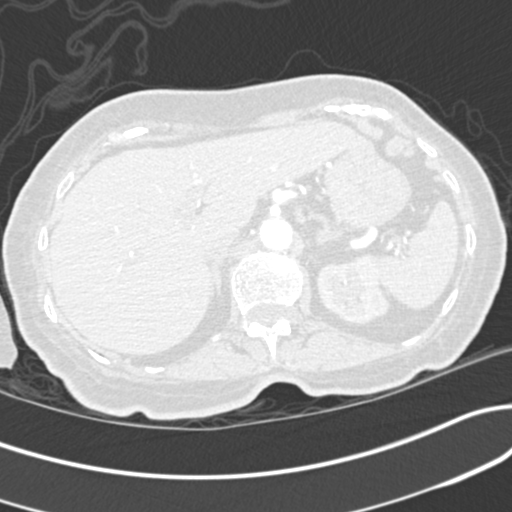
[im 33/292  mediastinal]
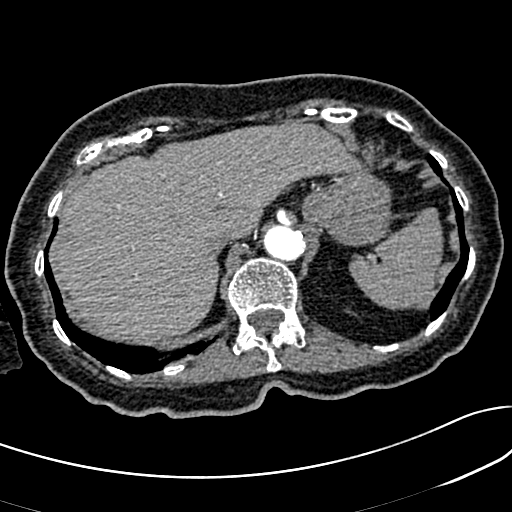
[im 49/292  lung]
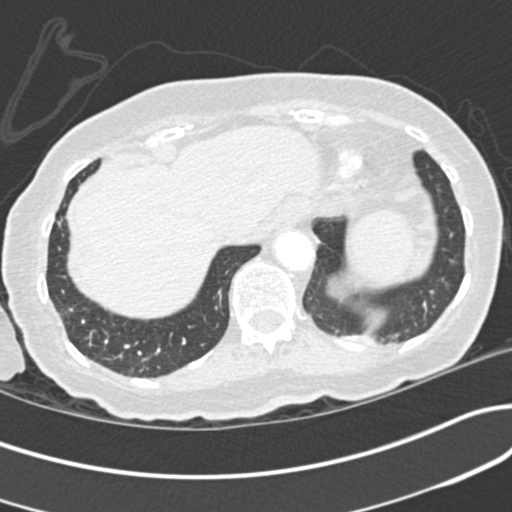
[im 65/292  mediastinal]
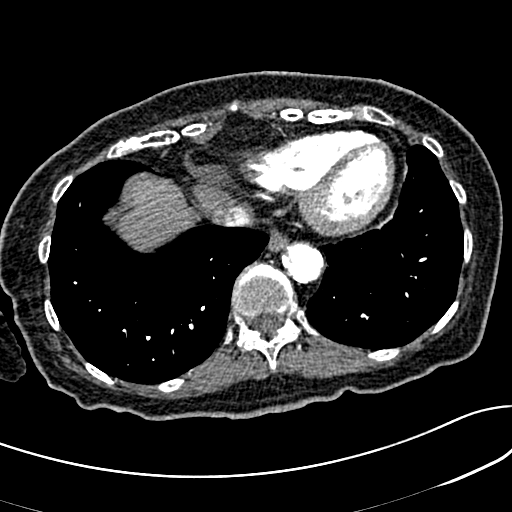
[im 81/292  lung]
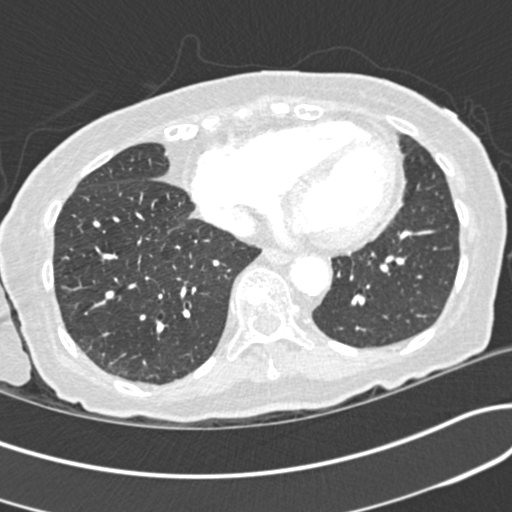
[im 98/292  mediastinal]
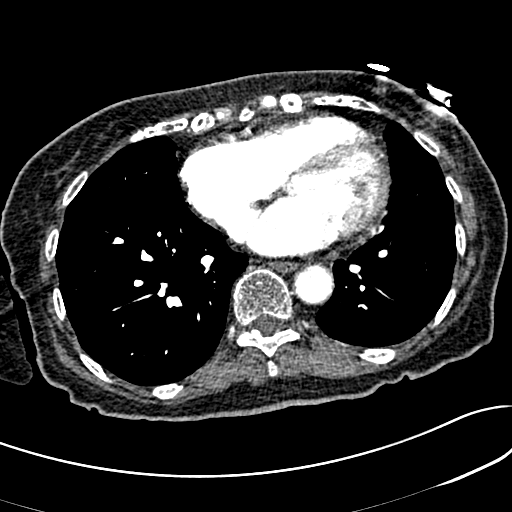
[im 114/292  lung]
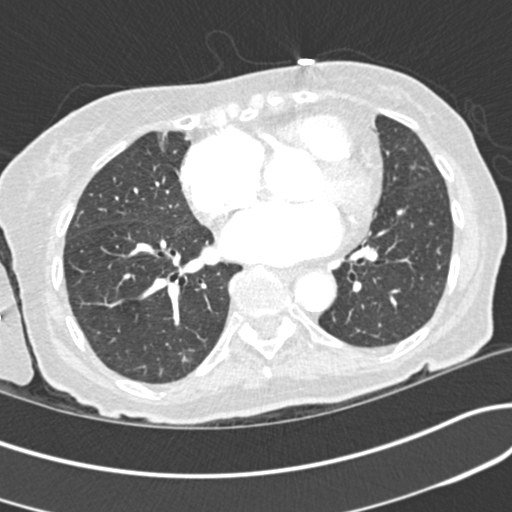
[im 130/292  mediastinal]
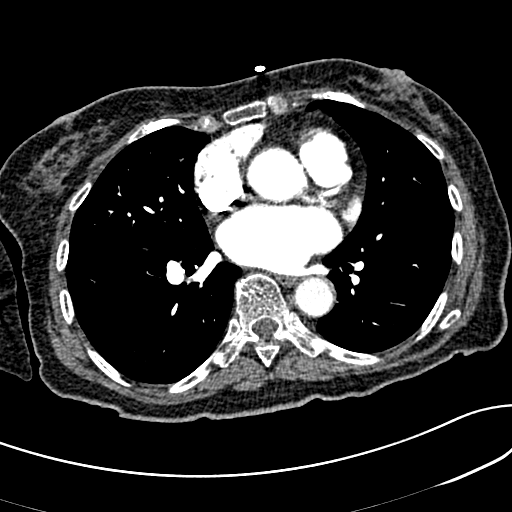
[im 146/292  lung]
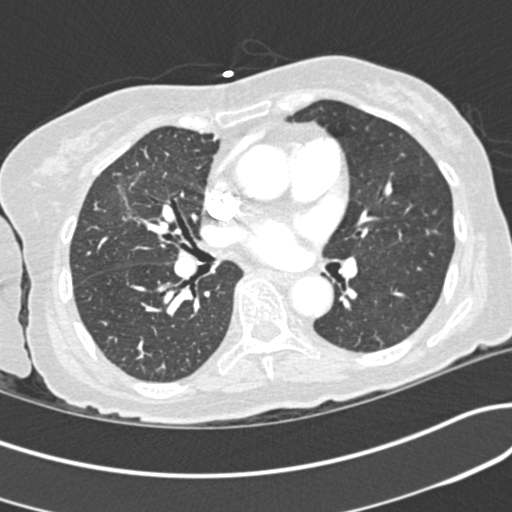
[im 162/292  mediastinal]
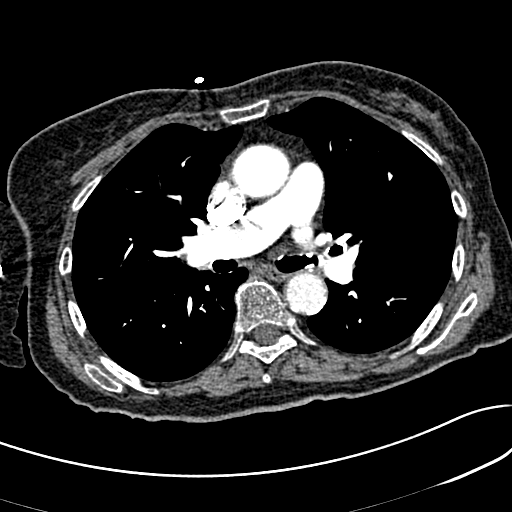
[im 178/292  lung]
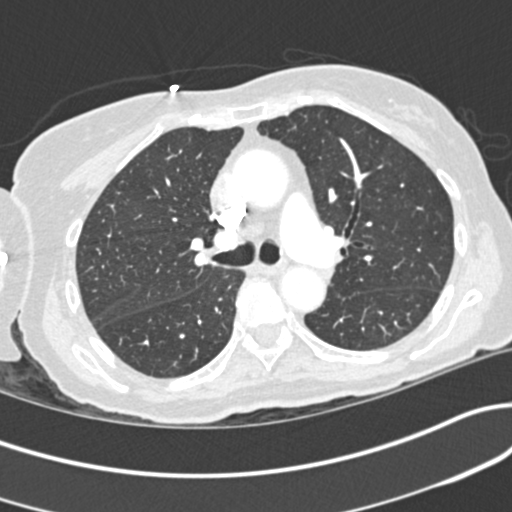
[im 195/292  mediastinal]
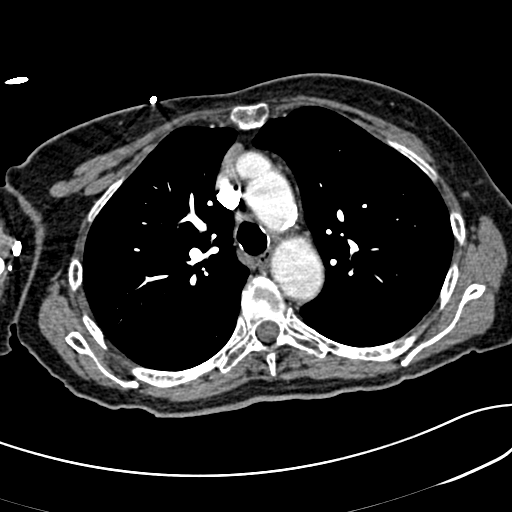
[im 211/292  lung]
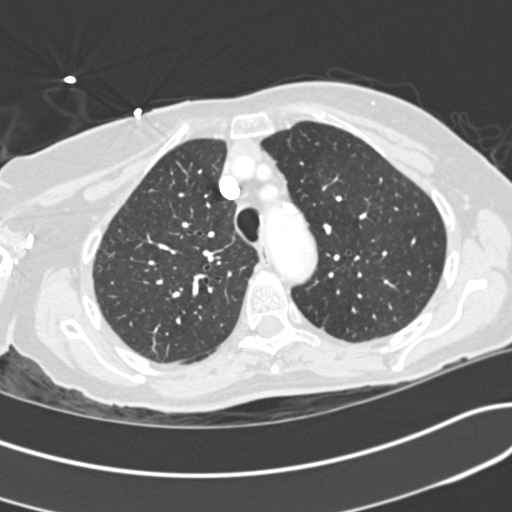
[im 227/292  mediastinal]
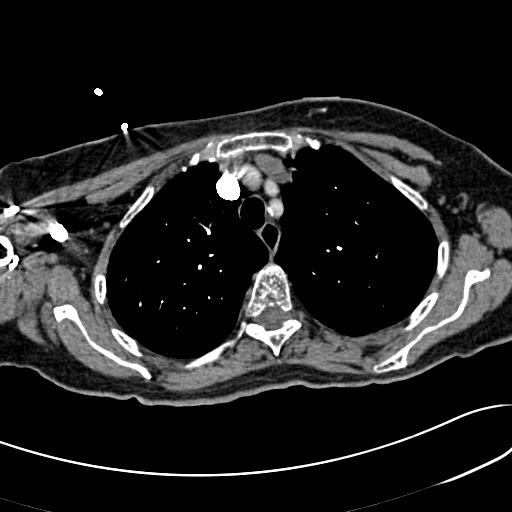
[im 243/292  lung]
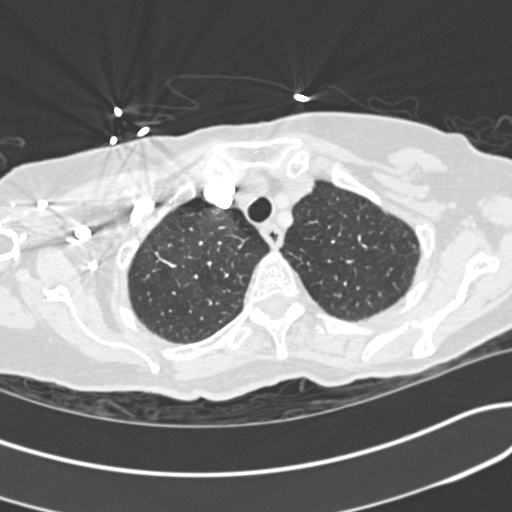
[im 259/292  mediastinal]
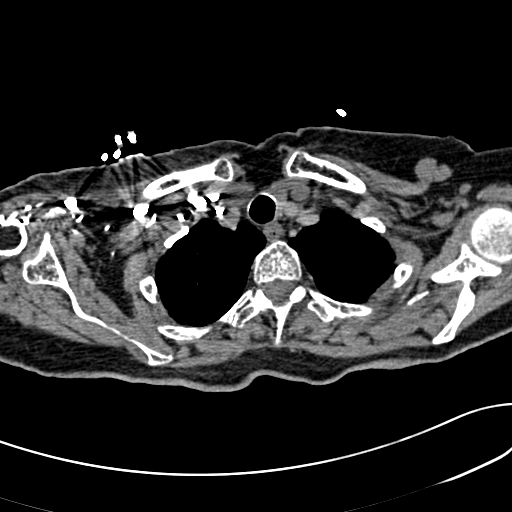
[im 275/292  lung]
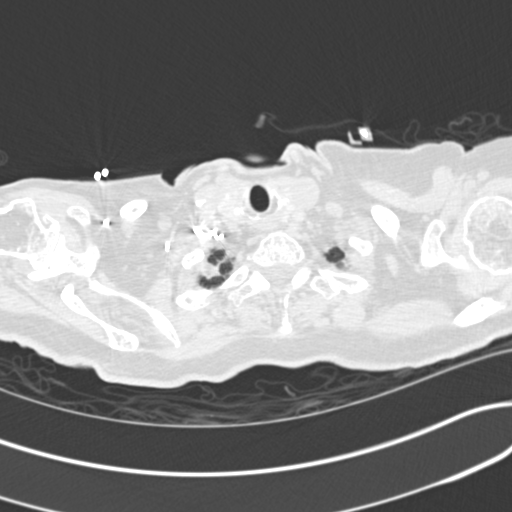

[Series 7: cor pe 2.0 mpr · coronal · 0.59mm/px · 1 of 100 slices shown]
[im 50/100  mediastinal]
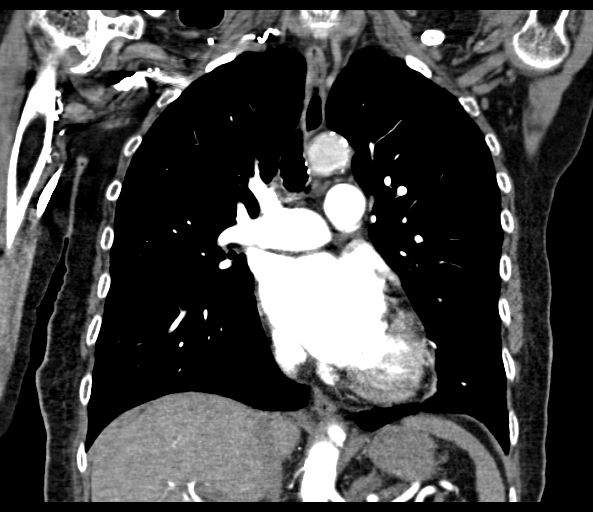

[18 of 36 positions shown; findings below may reference images not displayed]

FINDINGS: No filling defects in the pulmonary arteries to suggest pulmonary
emboli. Mild cardiomegaly. Densely calcified coronary arteries.
Scattered aortic calcifications. No Michaelle aneurysm or dissection. No
mediastinal, hilar, or axillary adenopathy.

Biapical scarring.  Lungs otherwise clear.  No pleural effusions.

Chest wall soft tissues are unremarkable. Imaging into the upper
abdomen shows no acute findings. No acute bony abnormality.

Review of the MIP images confirms the above findings.
IMPRESSION: No evidence of pulmonary embolus.

Cardiomegaly, coronary artery disease.

No acute cardiopulmonary disease.

## 2015-01-09 DIAGNOSIS — G309 Alzheimer's disease, unspecified: Secondary | ICD-10-CM

## 2015-01-09 DIAGNOSIS — I4891 Unspecified atrial fibrillation: Secondary | ICD-10-CM | POA: Diagnosis not present

## 2015-01-09 DIAGNOSIS — R451 Restlessness and agitation: Secondary | ICD-10-CM | POA: Diagnosis not present

## 2015-01-09 DIAGNOSIS — I10 Essential (primary) hypertension: Secondary | ICD-10-CM | POA: Diagnosis not present

## 2015-01-09 DIAGNOSIS — K219 Gastro-esophageal reflux disease without esophagitis: Secondary | ICD-10-CM

## 2015-03-19 DIAGNOSIS — I10 Essential (primary) hypertension: Secondary | ICD-10-CM | POA: Diagnosis not present

## 2015-03-19 DIAGNOSIS — I480 Paroxysmal atrial fibrillation: Secondary | ICD-10-CM

## 2015-03-19 DIAGNOSIS — G301 Alzheimer's disease with late onset: Secondary | ICD-10-CM | POA: Diagnosis not present

## 2015-03-19 DIAGNOSIS — R451 Restlessness and agitation: Secondary | ICD-10-CM | POA: Diagnosis not present

## 2015-05-06 DIAGNOSIS — C210 Malignant neoplasm of anus, unspecified: Secondary | ICD-10-CM | POA: Diagnosis not present

## 2015-05-06 DIAGNOSIS — G309 Alzheimer's disease, unspecified: Secondary | ICD-10-CM | POA: Diagnosis not present

## 2015-05-06 DIAGNOSIS — R451 Restlessness and agitation: Secondary | ICD-10-CM

## 2015-05-06 DIAGNOSIS — I10 Essential (primary) hypertension: Secondary | ICD-10-CM | POA: Diagnosis not present

## 2015-05-06 DIAGNOSIS — I4891 Unspecified atrial fibrillation: Secondary | ICD-10-CM

## 2015-05-19 DIAGNOSIS — R05 Cough: Secondary | ICD-10-CM | POA: Diagnosis not present

## 2015-05-19 DIAGNOSIS — R6883 Chills (without fever): Secondary | ICD-10-CM | POA: Diagnosis not present

## 2015-05-19 DIAGNOSIS — R5383 Other fatigue: Secondary | ICD-10-CM | POA: Diagnosis not present

## 2015-05-25 DIAGNOSIS — J209 Acute bronchitis, unspecified: Secondary | ICD-10-CM | POA: Diagnosis not present

## 2015-06-12 DIAGNOSIS — H00011 Hordeolum externum right upper eyelid: Secondary | ICD-10-CM | POA: Diagnosis not present

## 2015-07-07 DIAGNOSIS — R451 Restlessness and agitation: Secondary | ICD-10-CM | POA: Diagnosis not present

## 2015-07-16 DIAGNOSIS — G301 Alzheimer's disease with late onset: Secondary | ICD-10-CM | POA: Diagnosis not present

## 2015-07-16 DIAGNOSIS — K219 Gastro-esophageal reflux disease without esophagitis: Secondary | ICD-10-CM

## 2015-07-16 DIAGNOSIS — I480 Paroxysmal atrial fibrillation: Secondary | ICD-10-CM | POA: Diagnosis not present

## 2015-07-16 DIAGNOSIS — I10 Essential (primary) hypertension: Secondary | ICD-10-CM | POA: Diagnosis not present

## 2015-07-16 DIAGNOSIS — R451 Restlessness and agitation: Secondary | ICD-10-CM | POA: Diagnosis not present

## 2015-08-27 DIAGNOSIS — L03032 Cellulitis of left toe: Secondary | ICD-10-CM

## 2015-09-11 DIAGNOSIS — R451 Restlessness and agitation: Secondary | ICD-10-CM

## 2015-09-11 DIAGNOSIS — K219 Gastro-esophageal reflux disease without esophagitis: Secondary | ICD-10-CM | POA: Diagnosis not present

## 2015-09-11 DIAGNOSIS — I4891 Unspecified atrial fibrillation: Secondary | ICD-10-CM | POA: Diagnosis not present

## 2015-09-11 DIAGNOSIS — G309 Alzheimer's disease, unspecified: Secondary | ICD-10-CM | POA: Diagnosis not present

## 2015-09-11 DIAGNOSIS — I10 Essential (primary) hypertension: Secondary | ICD-10-CM | POA: Diagnosis not present

## 2015-11-26 DIAGNOSIS — I10 Essential (primary) hypertension: Secondary | ICD-10-CM

## 2015-11-26 DIAGNOSIS — G301 Alzheimer's disease with late onset: Secondary | ICD-10-CM | POA: Diagnosis not present

## 2015-11-26 DIAGNOSIS — I480 Paroxysmal atrial fibrillation: Secondary | ICD-10-CM | POA: Diagnosis not present

## 2015-11-26 DIAGNOSIS — K219 Gastro-esophageal reflux disease without esophagitis: Secondary | ICD-10-CM

## 2015-11-26 DIAGNOSIS — R451 Restlessness and agitation: Secondary | ICD-10-CM | POA: Diagnosis not present

## 2016-01-01 DIAGNOSIS — K219 Gastro-esophageal reflux disease without esophagitis: Secondary | ICD-10-CM | POA: Diagnosis not present

## 2016-01-01 DIAGNOSIS — G309 Alzheimer's disease, unspecified: Secondary | ICD-10-CM | POA: Diagnosis not present

## 2016-01-01 DIAGNOSIS — I4891 Unspecified atrial fibrillation: Secondary | ICD-10-CM

## 2016-01-01 DIAGNOSIS — I10 Essential (primary) hypertension: Secondary | ICD-10-CM

## 2016-01-01 DIAGNOSIS — K029 Dental caries, unspecified: Secondary | ICD-10-CM

## 2016-01-01 DIAGNOSIS — R451 Restlessness and agitation: Secondary | ICD-10-CM

## 2016-01-20 DIAGNOSIS — B351 Tinea unguium: Secondary | ICD-10-CM | POA: Diagnosis not present

## 2016-03-21 ENCOUNTER — Emergency Department: Payer: Medicare Other

## 2016-03-21 ENCOUNTER — Inpatient Hospital Stay
Admission: EM | Admit: 2016-03-21 | Discharge: 2016-03-23 | DRG: 689 | Disposition: A | Discharge status: Skilled Nursing Facility | Payer: Medicare Other | Attending: Internal Medicine | Admitting: Internal Medicine

## 2016-03-21 ENCOUNTER — Encounter: Payer: Self-pay | Admitting: Emergency Medicine

## 2016-03-21 DIAGNOSIS — Z993 Dependence on wheelchair: Secondary | ICD-10-CM

## 2016-03-21 DIAGNOSIS — G309 Alzheimer's disease, unspecified: Secondary | ICD-10-CM | POA: Diagnosis present

## 2016-03-21 DIAGNOSIS — I10 Essential (primary) hypertension: Secondary | ICD-10-CM | POA: Diagnosis present

## 2016-03-21 DIAGNOSIS — N179 Acute kidney failure, unspecified: Secondary | ICD-10-CM | POA: Diagnosis present

## 2016-03-21 DIAGNOSIS — I4891 Unspecified atrial fibrillation: Secondary | ICD-10-CM | POA: Diagnosis present

## 2016-03-21 DIAGNOSIS — Z90710 Acquired absence of both cervix and uterus: Secondary | ICD-10-CM

## 2016-03-21 DIAGNOSIS — Z9889 Other specified postprocedural states: Secondary | ICD-10-CM | POA: Diagnosis not present

## 2016-03-21 DIAGNOSIS — E860 Dehydration: Secondary | ICD-10-CM | POA: Diagnosis present

## 2016-03-21 DIAGNOSIS — Z9049 Acquired absence of other specified parts of digestive tract: Secondary | ICD-10-CM

## 2016-03-21 DIAGNOSIS — E43 Unspecified severe protein-calorie malnutrition: Secondary | ICD-10-CM | POA: Insufficient documentation

## 2016-03-21 DIAGNOSIS — F0280 Dementia in other diseases classified elsewhere without behavioral disturbance: Secondary | ICD-10-CM | POA: Diagnosis present

## 2016-03-21 DIAGNOSIS — M6281 Muscle weakness (generalized): Secondary | ICD-10-CM

## 2016-03-21 DIAGNOSIS — R4182 Altered mental status, unspecified: Secondary | ICD-10-CM

## 2016-03-21 DIAGNOSIS — N390 Urinary tract infection, site not specified: Principal | ICD-10-CM | POA: Diagnosis present

## 2016-03-21 DIAGNOSIS — Z79899 Other long term (current) drug therapy: Secondary | ICD-10-CM

## 2016-03-21 DIAGNOSIS — Z882 Allergy status to sulfonamides status: Secondary | ICD-10-CM | POA: Diagnosis not present

## 2016-03-21 DIAGNOSIS — R262 Difficulty in walking, not elsewhere classified: Secondary | ICD-10-CM

## 2016-03-21 DIAGNOSIS — Z66 Do not resuscitate: Secondary | ICD-10-CM | POA: Diagnosis present

## 2016-03-21 DIAGNOSIS — G9341 Metabolic encephalopathy: Secondary | ICD-10-CM | POA: Diagnosis present

## 2016-03-21 DIAGNOSIS — R739 Hyperglycemia, unspecified: Secondary | ICD-10-CM

## 2016-03-21 HISTORY — DX: Essential (primary) hypertension: I10

## 2016-03-21 HISTORY — DX: Alzheimer's disease, unspecified: G30.9

## 2016-03-21 HISTORY — DX: Unspecified atrial fibrillation: I48.91

## 2016-03-21 HISTORY — DX: Dementia in other diseases classified elsewhere, unspecified severity, without behavioral disturbance, psychotic disturbance, mood disturbance, and anxiety: F02.80

## 2016-03-21 LAB — URINALYSIS, COMPLETE (UACMP) WITH MICROSCOPIC
BILIRUBIN URINE: NEGATIVE
KETONES UR: NEGATIVE mg/dL
Nitrite: NEGATIVE
PH: 5 (ref 5.0–8.0)
Protein, ur: 30 mg/dL — AB
SPECIFIC GRAVITY, URINE: 1.027 (ref 1.005–1.030)

## 2016-03-21 LAB — GLUCOSE, CAPILLARY
GLUCOSE-CAPILLARY: 549 mg/dL — AB (ref 65–99)
Glucose-Capillary: 371 mg/dL — ABNORMAL HIGH (ref 65–99)

## 2016-03-21 LAB — COMPREHENSIVE METABOLIC PANEL
ALK PHOS: 140 U/L — AB (ref 38–126)
ALT: 18 U/L (ref 14–54)
ANION GAP: 14 (ref 5–15)
AST: 37 U/L (ref 15–41)
Albumin: 3.9 g/dL (ref 3.5–5.0)
BUN: 54 mg/dL — ABNORMAL HIGH (ref 6–20)
CALCIUM: 9.7 mg/dL (ref 8.9–10.3)
CO2: 24 mmol/L (ref 22–32)
Chloride: 107 mmol/L (ref 101–111)
Creatinine, Ser: 1.64 mg/dL — ABNORMAL HIGH (ref 0.44–1.00)
GFR, EST AFRICAN AMERICAN: 31 mL/min — AB (ref >60)
GFR, EST NON AFRICAN AMERICAN: 27 mL/min — AB (ref >60)
Glucose, Bld: 782 mg/dL (ref 65–99)
Potassium: 4 mmol/L (ref 3.5–5.1)
SODIUM: 145 mmol/L (ref 135–145)
TOTAL PROTEIN: 8.5 g/dL — AB (ref 6.5–8.1)
Total Bilirubin: 1 mg/dL (ref 0.3–1.2)

## 2016-03-21 LAB — CBC
HCT: 39.1 % (ref 35.0–47.0)
HEMOGLOBIN: 12.7 g/dL (ref 12.0–16.0)
MCH: 31.3 pg (ref 26.0–34.0)
MCHC: 32.5 g/dL (ref 32.0–36.0)
MCV: 96.4 fL (ref 80.0–100.0)
PLATELETS: 338 10*3/uL (ref 150–440)
RBC: 4.05 MIL/uL (ref 3.80–5.20)
RDW: 14.6 % — ABNORMAL HIGH (ref 11.5–14.5)
WBC: 14.9 10*3/uL — AB (ref 3.6–11.0)

## 2016-03-21 MED ORDER — SODIUM CHLORIDE 0.9 % IV BOLUS (SEPSIS)
1000.0000 mL | Freq: Once | INTRAVENOUS | Status: AC
  Administered 2016-03-21: 1000 mL via INTRAVENOUS

## 2016-03-21 MED ORDER — CEFTRIAXONE SODIUM-DEXTROSE 1-3.74 GM-% IV SOLR
INTRAVENOUS | Status: AC
  Administered 2016-03-21: 1 g via INTRAVENOUS
  Filled 2016-03-21: qty 50

## 2016-03-21 MED ORDER — QUETIAPINE FUMARATE 25 MG PO TABS
25.0000 mg | ORAL_TABLET | Freq: Two times a day (BID) | ORAL | Status: DC
  Administered 2016-03-22: 25 mg via ORAL
  Filled 2016-03-21 (×2): qty 1

## 2016-03-21 MED ORDER — CEFTRIAXONE SODIUM 1 G IJ SOLR: 1.0000 g | Freq: Once | INTRAMUSCULAR | Status: DC

## 2016-03-21 MED ORDER — NYSTATIN 100000 UNIT/GM EX POWD
Freq: Two times a day (BID) | CUTANEOUS | Status: DC
  Administered 2016-03-21 – 2016-03-23 (×4): via TOPICAL
  Filled 2016-03-21: qty 15

## 2016-03-21 MED ORDER — INSULIN ASPART 100 UNIT/ML ~~LOC~~ SOLN
8.0000 [IU] | Freq: Once | SUBCUTANEOUS | Status: AC
  Administered 2016-03-21: 8 [IU] via INTRAVENOUS
  Filled 2016-03-21: qty 8

## 2016-03-21 MED ORDER — CEFTRIAXONE SODIUM-DEXTROSE 1-3.74 GM-% IV SOLR
1.0000 g | Freq: Once | INTRAVENOUS | Status: AC
  Administered 2016-03-21: 1 g via INTRAVENOUS

## 2016-03-21 NOTE — ED Triage Notes (Signed)
Pt to ED via EMS from [facility], per EMS family states they think pt has increased AMS more than usual, pt has hx of dementia. Per facility pt is at baseline. EMS states glucose >500. Pt unable to follow commands or communicate. VS stable

## 2016-03-21 NOTE — H&P (Signed)
History and Physical   [facility] - [place] @ [facility] Admission History and Physical [employer], D.O.    Patient Name: Brandy Shepard MR#: [id number] Date of Birth: May 09, 1926 Date of Admission: 03/21/2016  Referring MD/NP/PA: Dr. Kerman Passey Primary Care Physician: Viviana Simpler, MD Patient coming from: nursing home Please note patient history is obtained entirely from the emergency department physician, chart and patient's daughters as patient's history is limited by dementia and altered mental status.  Chief Complaint: AMS  HPI: Brandy Shepard is a 83 y.o. female with a known history of dementia, alzheimers, afib, HTN was in a usual state of health until about 4 days ago when the patient's family grew concerned about increased lethargy, decreased responsiveness, decreased by mouth intake. Patient's family became concerned for infection and insisted that she be transferred for additional evaluation. Patient's daughter states that at baseline she is awake and alert, mumbles incoherently, however she is mostly bedbound or sits in a chair.  Patient's family are also concerned about possibility of a left-sided facial droop and thinks she may have had a stroke.   Otherwise there has been no change in status. Patient has been taking medication as prescribed and there has been no recent change in medication or diet.  No recent antibiotics.  There has been no recent illness, hospitalizations, travel or sick contacts.    Patient denies fevers/chills, weakness, dizziness, chest pain, shortness of breath, N/V/C/D, abdominal pain, dysuria/frequency, changes in mental status.   ED Course: In ED patient rec'd Rocephin, insulin, nystatin, 1LNS  Review of Systems:  Unable to obtain secondary to dementia and altered mental status   Past Medical History:  Diagnosis Date  . Alzheimer disease   . Atrial fibrillation ([place])   . Hypertension     Past Surgical History:  Procedure  Laterality Date  . ABDOMINAL HYSTERECTOMY    . CHOLECYSTECTOMY    . HIP FRACTURE SURGERY       reports that she has never smoked. She has never used smokeless tobacco. She reports that she does not drink alcohol. Her drug history is not on file.  Allergies  Allergen Reactions  . Sulfa Antibiotics    Unable to confirm family history  Prior to Admission medications   Medication Sig Start Date End Date Taking? Authorizing Provider  acetaminophen (TYLENOL) 325 MG tablet Take 650 mg by mouth 2 (two) times daily.   Yes Historical Provider, MD  bisacodyl (DULCOLAX) 5 MG EC tablet Take 10 mg by mouth daily as needed for moderate constipation.   Yes Historical Provider, MD  bisoprolol (ZEBETA) 5 MG tablet Take 5 mg by mouth daily.   Yes Historical Provider, MD  Docosanol (ABREVA EX) Apply topically.   Yes Historical Provider, MD  gabapentin (NEURONTIN) 100 MG capsule Take 100 mg by mouth 3 (three) times daily.   Yes Historical Provider, MD  hydrocortisone 2.5 % cream Apply topically 2 (two) times daily.   Yes Historical Provider, MD  [employer] [facility] EX) Apply topically.   Yes Historical Provider, MD  loperamide (IMODIUM A-D) 2 MG tablet Take 2 mg by mouth 4 (four) times daily as needed for diarrhea or loose stools.   Yes Historical Provider, MD  magnesium hydroxide (MILK OF MAGNESIA) 400 MG/5ML suspension Take 30 mLs by mouth daily as needed for mild constipation.   Yes Historical Provider, MD  Nutritional Supplements (ENSURE CLEAR) LIQD Take 1 Can by mouth 2 (two) times daily.   Yes Historical Provider, MD  nystatin (  NYSTATIN) powder Apply 1 g topically 2 (two) times daily.   Yes Historical Provider, MD  omeprazole (PRILOSEC) 20 MG capsule Take 20 mg by mouth daily.   Yes Historical Provider, MD  polyvinyl alcohol (LIQUIFILM TEARS) 1.4 % ophthalmic solution 1 drop as needed for dry eyes.   Yes Historical Provider, MD  QUEtiapine (SEROQUEL) 25 MG tablet Take 25 mg by mouth 2  (two) times daily.   Yes Historical Provider, MD  sodium chloride (OCEAN) 0.65 % SOLN nasal spray Place 1 spray into both nostrils as needed for congestion.   Yes Historical Provider, MD  Vitamin D, Ergocalciferol, (DRISDOL) 50000 units CAPS capsule Take 50,000 Units by mouth every 7 (seven) days.   Yes Historical Provider, MD    Physical Exam: Vitals:   03/21/16 1745 03/21/16 1900 03/21/16 2050  BP: 125/87 (!) 115/96   Pulse: 60  79  Resp: 16 18 19   Temp: 97.1 F (36.2 C)    TempSrc: Axillary    SpO2: 100%  96%  Weight: 47.6 kg (105 lb)    Height: 5\' 5"  (1.651 m)      GENERAL: 83 y.o.-year-old Chronically ill-appearing female patient, well-developed, well-nourished lying in the bed in no acute distress. Patient mumbles incoherently, does open her eyes to verbal stimuli but does not interact, answer questions or follow commands. HEENT: Head atraumatic, normocephalic. Pupils equal, round, reactive to light and accommodation. No scleral icterus. Nares are patent. Oropharynx is clear. Mucus membranes very dry. NECK: Supple. No JVD, no bruit heard. No thyroid enlargement, no tenderness, no cervical lymphadenopathy. CHEST: Normal breath sounds bilaterally. No wheezing, rales, rhonchi or crackles. No use of accessory muscles of respiration.  No reproducible chest wall tenderness.  CARDIOVASCULAR: S1, S2 normal. No murmurs, rubs, or gallops. Cap refill <2 seconds. Pulses intact distally.  ABDOMEN: Soft, nondistended, nontender. No rebound, guarding, rigidity. Normoactive bowel sounds present in all four quadrants. No organomegaly or mass.  EXTREMITIES: No pedal edema, cyanosis, or clubbing. No calf tenderness or Homan's sign.  NEUROLOGIC: The patient is arousable to verbal stimuli. She is intermittently agitated. There appears to be some mild swelling of the left face which is how she has been lying in the bed. Looks like dependent edema. There is no facial droop or facial asymmetry. SKIN: Warm,  dry, and intact without obvious rash, lesion, or ulcer with the exception of erythema and moisture in the intertriginous area of the groin.    Labs on Admission:  CBC:  Recent Labs Lab 03/21/16 1745  WBC 14.9*  HGB 12.7  HCT 39.1  MCV 96.4  PLT [id number]   Basic Metabolic Panel:  Recent Labs Lab 03/21/16 1745  NA 145  K 4.0  CL 107  CO2 24  GLUCOSE 782*  BUN 54*  CREATININE 1.64*  CALCIUM 9.7   GFR: Estimated Creatinine Clearance: 18.2 mL/min (by C-G formula based on SCr of 1.64 mg/dL (H)). Liver Function Tests:  Recent Labs Lab 03/21/16 1745  AST 37  ALT 18  ALKPHOS 140*  BILITOT 1.0  PROT 8.5*  ALBUMIN 3.9   No results for input(s): LIPASE, AMYLASE in the last 168 hours. No results for input(s): AMMONIA in the last 168 hours. Coagulation Profile: No results for input(s): INR, PROTIME in the last 168 hours. Cardiac Enzymes: No results for input(s): CKTOTAL, CKMB, CKMBINDEX, TROPONINI in the last 168 hours. BNP (last 3 results) No results for input(s): PROBNP in the last 8760 hours. HbA1C: No results for input(s): HGBA1C in the  last 72 hours. CBG:  Recent Labs Lab 03/21/16 2047  GLUCAP 549*   Lipid Profile: No results for input(s): CHOL, HDL, LDLCALC, TRIG, CHOLHDL, LDLDIRECT in the last 72 hours. Thyroid Function Tests: No results for input(s): TSH, T4TOTAL, FREET4, T3FREE, THYROIDAB in the last 72 hours. Anemia Panel: No results for input(s): VITAMINB12, FOLATE, FERRITIN, TIBC, IRON, RETICCTPCT in the last 72 hours. Urine analysis:    Component Value Date/Time   COLORURINE YELLOW (A) 03/21/2016 1749   APPEARANCEUR TURBID (A) 03/21/2016 1749   APPEARANCEUR Clear 04/10/2013 2138   LABSPEC 1.027 03/21/2016 1749   LABSPEC 1.017 04/10/2013 2138   PHURINE 5.0 03/21/2016 1749   GLUCOSEU >=500 (A) 03/21/2016 1749   GLUCOSEU Negative 04/10/2013 2138   HGBUR SMALL (A) 03/21/2016 1749   BILIRUBINUR NEGATIVE 03/21/2016 1749   BILIRUBINUR Negative  04/10/2013 2138   KETONESUR NEGATIVE 03/21/2016 1749   PROTEINUR 30 (A) 03/21/2016 1749   NITRITE NEGATIVE 03/21/2016 1749   LEUKOCYTESUR LARGE (A) 03/21/2016 1749   LEUKOCYTESUR Negative 04/10/2013 2138   Sepsis Labs: @LABRCNTIP (procalcitonin:4,lacticidven:4) )No results found for this or any previous visit (from the past 240 hour(s)).   Radiological Exams on Admission: Ct Head Wo Contrast  Result Date: 03/21/2016 CLINICAL DATA:  Unresponsive x2 days with facial droop and slurred speech. EXAM: CT HEAD WITHOUT CONTRAST TECHNIQUE: Contiguous axial images were obtained from the base of the skull through the vertex without intravenous contrast. COMPARISON:  04/03/2013 CT FINDINGS: Brain: Mild superficial and moderate central atrophy with mild to moderate degree of chronic small vessel ischemic disease of periventricular white matter. No acute intracranial hemorrhage, mass or midline shift. No extra-axial fluid collections. No acute large vascular territory infarct. No effacement of the basal cisterns or fourth ventricle. Vascular: No hyperdense vessel or unexpected calcification. Moderate degree of carotid siphon calcifications bilaterally. Skull: Nonacute.  Mastoids are clear bilaterally. Sinuses/Orbits: Mucous retention cyst of the left maxillary sinus is partially imaged. Orbits are nonacute. Globes demonstrate bilateral lens replacements surgeries. Other: None IMPRESSION: Cerebral atrophy with chronic mild-to-moderate degree of small vessel ischemic disease involving the deep cerebral white matter. No acute intracranial abnormality. Electronically Signed   By: Ashley Royalty M.D.   On: 03/21/2016 20:11    Assessment/Plan  This is a 83 y.o. female with a history of dementia, alzheimers, afib, HTN now being admitted with: 1. AMS 2/2 dehydration and infection-UTI plus minus sacral decubitus infection -Admit to inpatient -IV fluid hydration -We will expand antibiotic coverage to IV Zosyn and Vanco  given the possibility of sacral decubitus wound infection. -Follow blood, urine, wound cultures -Repeat CBC in a.m. -Infectious disease consultation has been requested -We will request speech and swallowing consultation given altered mental status  2. AKI -IV fluid hydration and repeat BMP in a.m.  3. Hyperglycemia, possibly secondary to dehydration and infection. No known history of diabetes -We'll cover with regular insulin sliding scale coverage as needed for tight glucose control given multiple infections -Check hemoglobin A1c  4. I did discuss the course of action regarding stroke workup with the patient's 2 daughters. They're in agreement with holding off on the MRI for now given lack of focal neurologic deficit, lack of ability to assess the patient's neurologic status, inability for patient to lie still for MRI and the fact that confirmation of a stroke would not change her management other than adding aspirin to her regimen. We will observe and if mental status improves with IV fluid and IV antibiotics and will not be pursuing any  additional workup for stroke. We will add aspirin 81 mg daily.    Admission status: Inpatient IV Fluids: NS Diet/Nutrition: HH Consults called: PT, wound care, speech and swallow, social work DVT Px: Lovenox, SCDs and early ambulation. Code Status: Full Code  Disposition Plan: TBD   All the records are reviewed and case discussed with ED provider. Management plans discussed with the patient and/or family who express understanding and agree with plan of care.  Ahmad Vanwey D.O. on 03/21/2016 at 9:02 PM Between 7am to 6pm - Pager - [phone] After 6pm go to [email or web address] - [employer] [facility] [place] [facility] Office [phone] CC: Primary care physician; Viviana Simpler, MD   03/21/2016, 9:02 PM

## 2016-03-21 NOTE — ED Provider Notes (Signed)
[facility] Emergency Department Provider Note  Time seen: 6:00 PM  I have reviewed the triage vital signs and the nursing notes.   HISTORY  Chief Complaint Altered Mental Status    HPI Brandy Shepard is a 83 y.o. female with a past medical history of dementia, presents from a nursing facility for possible altered mental status. According to EMS report family was visiting the patient today and stated that she has baseline dementia but was acting more altered than normal and requested ER evaluation. EMS states the nursing home staff states this is the patient's baseline and believe the patient to be acting normal. Patient has significant dementia, cannot contribute to her history currently. Patient has no complains. No reported trauma.  Past Medical History:  Diagnosis Date  . Alzheimer disease     There are no active problems to display for this patient.   No past surgical history on file.  Prior to Admission medications   Not on File    Allergies not on file  No family history on file.  Social History Social History  Substance Use Topics  . Smoking status: Not on file  . Smokeless tobacco: Not on file  . Alcohol use Not on file    Review of Systems Unable to obtain a review of systems due to altered mental status/dementia.  ____________________________________________   PHYSICAL EXAM:  VITAL SIGNS: ED Triage Vitals [03/21/16 1745]  Enc Vitals Group     BP 125/87     Pulse Rate 60     Resp 16     Temp 97.1 F (36.2 C)     Temp Source Axillary     SpO2 100 %     Weight 105 lb (47.6 kg)     Height 5\' 5"  (1.651 m)     Head Circumference      Peak Flow      Pain Score      Pain Loc      Pain Edu?      Excl. in [place]?     Constitutional: Alert. Eyes open. Patient moans, yells and fights back to painful stimuli such as in and out catheterization. Unable to answer questions or follow commands. Eyes: Normal exam ENT   Head:  Normocephalic and atraumatic.   Mouth/Throat: Dry mucous membranes. Cardiovascular: Normal rate, regular rhythm.  Respiratory: Normal respiratory effort without tachypnea nor retractions. Breath sounds are clear and no apparent cough. Gastrointestinal: Soft and nontender. No distention.  No reaction to abdominal palpation. Patient does have significant rash/dermatitis to her genitals/diapered area most consistent with Candida infection. Musculoskeletal: Nontender with normal range of motion in all extremities.  Neurologic:  Patient yells and fights back when attempting to hold down for catheterization. Patient moans, but does not answer questions or follow commands. It is unclear at this time with the patient's true baseline is given the different reports from family and nursing home staff. Currently awaiting family arrival. Skin:  Skin is warm, dry and intact. Rash to groin area as noted above most consistent with Candida.  ____________________________________________   INITIAL IMPRESSION / ASSESSMENT AND PLAN / ED COURSE  Pertinent labs & imaging results that were available during my care of the patient were reviewed by me and considered in my medical decision making (see chart for details).  Patient presents the emergency department with reported altered mental status. Patient has significant dementia at baseline. Currently awaiting family arrival for more information. We will check labs, urinalysis and  closely monitor in the department. Vitals are largely within normal limits, afebrile no cough or congestion noted. No signs of trauma on examination.  CT negative for acute abnormality.  Family is now here who state the patient is typically verbal baseline this is a huge change from baseline per family over the past 2-3 days. Completely nonverbal unable to eat or drink anything today at the nursing facility. Given the patient's significant urinary tract infection hyperglycemia with no  history of diabetes in the past the patient will be admitted to the hospital for treatment of her urinary tract infection, altered mental status. Family is agreeable to plan.  ____________________________________________   FINAL CLINICAL IMPRESSION(S) / ED DIAGNOSES  Altered mental status Urinary tract infection Hyperglycemia   Harvest Dark, MD 03/21/16 2023

## 2016-03-21 NOTE — ED Notes (Signed)
Patient transported to CT 

## 2016-03-22 DIAGNOSIS — E43 Unspecified severe protein-calorie malnutrition: Secondary | ICD-10-CM | POA: Insufficient documentation

## 2016-03-22 LAB — BASIC METABOLIC PANEL
Anion gap: 10 (ref 5–15)
BUN: 43 mg/dL — ABNORMAL HIGH (ref 6–20)
CALCIUM: 9.3 mg/dL (ref 8.9–10.3)
CHLORIDE: 120 mmol/L — AB (ref 101–111)
CO2: 25 mmol/L (ref 22–32)
CREATININE: 1.08 mg/dL — AB (ref 0.44–1.00)
GFR calc non Af Amer: 45 mL/min — ABNORMAL LOW (ref >60)
GFR, EST AFRICAN AMERICAN: 52 mL/min — AB (ref >60)
GLUCOSE: 301 mg/dL — AB (ref 65–99)
Potassium: 3.3 mmol/L — ABNORMAL LOW (ref 3.5–5.1)
Sodium: 155 mmol/L — ABNORMAL HIGH (ref 135–145)

## 2016-03-22 LAB — CBC
HCT: 35.1 % (ref 35.0–47.0)
Hemoglobin: 11.6 g/dL — ABNORMAL LOW (ref 12.0–16.0)
MCH: 31.4 pg (ref 26.0–34.0)
MCHC: 33.1 g/dL (ref 32.0–36.0)
MCV: 94.8 fL (ref 80.0–100.0)
PLATELETS: 294 10*3/uL (ref 150–440)
RBC: 3.71 MIL/uL — ABNORMAL LOW (ref 3.80–5.20)
RDW: 14.1 % (ref 11.5–14.5)
WBC: 17.5 10*3/uL — ABNORMAL HIGH (ref 3.6–11.0)

## 2016-03-22 LAB — GLUCOSE, CAPILLARY
GLUCOSE-CAPILLARY: 120 mg/dL — AB (ref 65–99)
GLUCOSE-CAPILLARY: 163 mg/dL — AB (ref 65–99)
GLUCOSE-CAPILLARY: 339 mg/dL — AB (ref 65–99)
GLUCOSE-CAPILLARY: 511 mg/dL — AB (ref 65–99)
Glucose-Capillary: 276 mg/dL — ABNORMAL HIGH (ref 65–99)
Glucose-Capillary: 441 mg/dL — ABNORMAL HIGH (ref 65–99)
Glucose-Capillary: 309 mg/dL — ABNORMAL HIGH (ref 65–99)

## 2016-03-22 LAB — PHOSPHORUS: PHOSPHORUS: 2.2 mg/dL — AB (ref 2.5–4.6)

## 2016-03-22 LAB — MRSA PCR SCREENING: MRSA BY PCR: POSITIVE — AB

## 2016-03-22 LAB — MAGNESIUM: Magnesium: 1.9 mg/dL (ref 1.7–2.4)

## 2016-03-22 MED ORDER — INSULIN GLARGINE 100 UNIT/ML ~~LOC~~ SOLN
20.0000 [IU] | Freq: Every day | SUBCUTANEOUS | Status: DC
  Administered 2016-03-22: 20 [IU] via SUBCUTANEOUS
  Filled 2016-03-22 (×2): qty 0.2

## 2016-03-22 MED ORDER — GABAPENTIN 100 MG PO CAPS
100.0000 mg | ORAL_CAPSULE | Freq: Three times a day (TID) | ORAL | Status: DC
  Administered 2016-03-22: 100 mg via ORAL
  Filled 2016-03-22: qty 1

## 2016-03-22 MED ORDER — BISACODYL 5 MG PO TBEC: 5.0000 mg | DELAYED_RELEASE_TABLET | Freq: Every day | ORAL | Status: DC | PRN

## 2016-03-22 MED ORDER — IPRATROPIUM BROMIDE 0.02 % IN SOLN: 0.5000 mg | Freq: Four times a day (QID) | RESPIRATORY_TRACT | Status: DC | PRN

## 2016-03-22 MED ORDER — LORAZEPAM 2 MG/ML IJ SOLN: 0.5000 mg | Freq: Four times a day (QID) | INTRAMUSCULAR | Status: DC | PRN

## 2016-03-22 MED ORDER — NYSTATIN 100000 UNIT/GM EX POWD
1.0000 g | Freq: Two times a day (BID) | CUTANEOUS | Status: DC
  Administered 2016-03-22 (×2): 1 g via TOPICAL
  Filled 2016-03-22: qty 15

## 2016-03-22 MED ORDER — ENOXAPARIN SODIUM 40 MG/0.4ML ~~LOC~~ SOLN: 40.0000 mg | SUBCUTANEOUS | Status: DC

## 2016-03-22 MED ORDER — BISOPROLOL FUMARATE 5 MG PO TABS
5.0000 mg | ORAL_TABLET | Freq: Every day | ORAL | Status: DC
  Filled 2016-03-22: qty 1

## 2016-03-22 MED ORDER — OXYCODONE HCL 5 MG PO TABS: 5.0000 mg | ORAL_TABLET | ORAL | Status: DC | PRN

## 2016-03-22 MED ORDER — ONDANSETRON HCL 4 MG/2ML IJ SOLN: 4.0000 mg | Freq: Four times a day (QID) | INTRAMUSCULAR | Status: DC | PRN

## 2016-03-22 MED ORDER — PIPERACILLIN-TAZOBACTAM 3.375 G IVPB
3.3750 g | Freq: Three times a day (TID) | INTRAVENOUS | Status: DC
  Administered 2016-03-22 – 2016-03-23 (×4): 3.375 g via INTRAVENOUS
  Filled 2016-03-22 (×4): qty 50

## 2016-03-22 MED ORDER — VANCOMYCIN HCL IN DEXTROSE 750-5 MG/150ML-% IV SOLN
750.0000 mg | Freq: Once | INTRAVENOUS | Status: AC
  Administered 2016-03-22: 750 mg via INTRAVENOUS
  Filled 2016-03-22: qty 150

## 2016-03-22 MED ORDER — ONDANSETRON HCL 4 MG PO TABS: 4.0000 mg | ORAL_TABLET | Freq: Four times a day (QID) | ORAL | Status: DC | PRN

## 2016-03-22 MED ORDER — PANTOPRAZOLE SODIUM 40 MG PO TBEC: 40.0000 mg | DELAYED_RELEASE_TABLET | Freq: Every day | ORAL | Status: DC

## 2016-03-22 MED ORDER — BOOST / RESOURCE BREEZE PO LIQD
1.0000 | Freq: Three times a day (TID) | ORAL | Status: DC
  Administered 2016-03-22 (×2): 1 via ORAL

## 2016-03-22 MED ORDER — INSULIN ASPART 100 UNIT/ML ~~LOC~~ SOLN
0.0000 [IU] | Freq: Three times a day (TID) | SUBCUTANEOUS | Status: DC
  Administered 2016-03-22: 5 [IU] via SUBCUTANEOUS
  Administered 2016-03-22: 12 [IU] via SUBCUTANEOUS
  Administered 2016-03-23: 2 [IU] via SUBCUTANEOUS
  Filled 2016-03-22: qty 2
  Filled 2016-03-22: qty 5
  Filled 2016-03-22: qty 8
  Filled 2016-03-22: qty 12

## 2016-03-22 MED ORDER — ENOXAPARIN SODIUM 40 MG/0.4ML ~~LOC~~ SOLN
40.0000 mg | SUBCUTANEOUS | Status: DC
  Administered 2016-03-22: 40 mg via SUBCUTANEOUS
  Filled 2016-03-22: qty 0.4

## 2016-03-22 MED ORDER — LOPERAMIDE HCL 2 MG PO CAPS: 2.0000 mg | ORAL_CAPSULE | Freq: Four times a day (QID) | ORAL | Status: DC | PRN

## 2016-03-22 MED ORDER — VANCOMYCIN HCL IN DEXTROSE 750-5 MG/150ML-% IV SOLN
750.0000 mg | INTRAVENOUS | Status: DC
  Administered 2016-03-22: 750 mg via INTRAVENOUS
  Filled 2016-03-22: qty 150

## 2016-03-22 MED ORDER — INSULIN ASPART 100 UNIT/ML ~~LOC~~ SOLN
0.0000 [IU] | Freq: Every day | SUBCUTANEOUS | Status: DC
  Administered 2016-03-22: 4 [IU] via SUBCUTANEOUS
  Filled 2016-03-22: qty 4

## 2016-03-22 MED ORDER — ACETAMINOPHEN 650 MG RE SUPP: 650.0000 mg | Freq: Four times a day (QID) | RECTAL | Status: DC | PRN

## 2016-03-22 MED ORDER — ENOXAPARIN SODIUM 30 MG/0.3ML ~~LOC~~ SOLN: 30.0000 mg | SUBCUTANEOUS | Status: DC

## 2016-03-22 MED ORDER — INSULIN ASPART 100 UNIT/ML ~~LOC~~ SOLN
8.0000 [IU] | Freq: Once | SUBCUTANEOUS | Status: AC
  Administered 2016-03-22: 8 [IU] via SUBCUTANEOUS

## 2016-03-22 MED ORDER — SODIUM CHLORIDE 0.9 % IV SOLN
INTRAVENOUS | Status: DC
  Administered 2016-03-22 (×2): via INTRAVENOUS

## 2016-03-22 MED ORDER — SENNOSIDES-DOCUSATE SODIUM 8.6-50 MG PO TABS: 1.0000 | ORAL_TABLET | Freq: Every evening | ORAL | Status: DC | PRN

## 2016-03-22 MED ORDER — POLYVINYL ALCOHOL 1.4 % OP SOLN
1.0000 [drp] | OPHTHALMIC | Status: DC | PRN
  Filled 2016-03-22: qty 15

## 2016-03-22 MED ORDER — MAGNESIUM CITRATE PO SOLN
1.0000 | Freq: Once | ORAL | Status: DC | PRN
  Filled 2016-03-22: qty 296

## 2016-03-22 MED ORDER — ALBUTEROL SULFATE (2.5 MG/3ML) 0.083% IN NEBU: 2.5000 mg | INHALATION_SOLUTION | Freq: Four times a day (QID) | RESPIRATORY_TRACT | Status: DC | PRN

## 2016-03-22 MED ORDER — CEFTRIAXONE SODIUM-DEXTROSE 2-2.22 GM-% IV SOLR: 2.0000 g | INTRAVENOUS | Status: DC

## 2016-03-22 MED ORDER — SALINE SPRAY 0.65 % NA SOLN
1.0000 | NASAL | Status: DC | PRN
  Filled 2016-03-22: qty 44

## 2016-03-22 MED ORDER — ASPIRIN EC 81 MG PO TBEC: 81.0000 mg | DELAYED_RELEASE_TABLET | Freq: Every day | ORAL | Status: DC

## 2016-03-22 MED ORDER — LORAZEPAM 2 MG/ML IJ SOLN: 0.5000 mg | Freq: Once | INTRAMUSCULAR | Status: DC

## 2016-03-22 MED ORDER — ACETAMINOPHEN 325 MG PO TABS: 650.0000 mg | ORAL_TABLET | Freq: Four times a day (QID) | ORAL | Status: DC | PRN

## 2016-03-22 MED ORDER — ENSURE ENLIVE PO LIQD: 237.0000 mL | Freq: Two times a day (BID) | ORAL | Status: DC

## 2016-03-22 NOTE — Consult Note (Signed)
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 Wears a disposable brief all the time.  Discouraged use of briefs while here.  Staff can manage incontinence.  Dermatherapy therapeutic linen will wick away moisture and manage skin's microclimate.   Wound type:Moisture associated (incontinence) skin damage.  Pressure Injury POA:N/A Measurement:Erythema to bilateral buttocks in gluteal fold and perineal folds.  Wound [id number] and moist.  Scattered nonintact areas Drainage (amount, consistency, odor) Scant serous weeping Periwound:blanchable erythema Dressing procedure/placement/frequency:Cleanse buttocks and perineum with soap and water daily.  Dermatherapy sheets to be incontact with skin (no dispoables).  Place pillow case between inguinal folds to wick moisture and improve skin's microclimate.  Change daily and PRN incontinence.  Will recommend Diflucan for fungal component.  Will not follow at this time.  Please re-consult if needed.  Domenic Moras RN BSN [place] Pager [phone]

## 2016-03-22 NOTE — Clinical Social Work Note (Signed)
Clinical Social Work Assessment  Patient Details  Name: Brandy Shepard MRN: [id number] Date of Birth: [birth year]  Date of referral:  03/22/16               Reason for consult:  Facility Placement                Permission sought to share information with:    Permission granted to share information::     Name::        Agency::     Relationship::     Contact Information:     Housing/Transportation Living arrangements for the past 2 months:  [place] of Information:  Adult Children Patient Interpreter Needed:  None Criminal Activity/Legal Involvement Pertinent to Current Situation/Hospitalization:  No - Comment as needed Significant Relationships:  Adult Children Lives with:  Facility Resident Do you feel safe going back to the place where you live?  Yes Need for family participation in patient care:  Yes (Comment)  Care giving concerns:  Patient is a long term resident at [facility].   Social Worker assessment / plan:  CSW informed by PT that patient might benefit from some rehab when she returns to [facility]. CSW spoke with patient's daughters this afternoon and they are aware of PT recommendations. CSW contacted Seth Bake at [facility] and she stated patient can be admitted for rehab. CSW has relayed this to patient's daughters and they are very pleased.   Employment status:  Retired Forensic scientist:  Medicare PT Recommendations:  [place] / Referral to community resources:     Patient/Family's Response to care:  Patient's daughters expressed appreciation for CSW assistance.   Patient/Family's Understanding of and Emotional Response to Diagnosis, Current Treatment, and Prognosis:  Patient's daughters verbalized understanding of the plan for rehab and are aware that CSW will facilitate discharge when time.  Emotional Assessment Appearance:  Appears stated age Attitude/Demeanor/Rapport:  Unable to Assess Affect  (typically observed):  Unable to Assess Orientation:  Oriented to Self Alcohol / Substance use:  Not Applicable Psych involvement (Current and /or in the community):  No (Comment)  Discharge Needs  Concerns to be addressed:  Care Coordination Readmission within the last 30 days:  No Current discharge risk:  None Barriers to Discharge:  No Barriers Identified   Shela Leff, LCSW 03/22/2016, 3:40 PM

## 2016-03-22 NOTE — Progress Notes (Addendum)
Pharmacy Antibiotic Note  Brandy Shepard is a 83 y.o. female admitted on 03/21/2016 with UTI/wound infection.  Pharmacy has been consulted for vancomycin and zosyn dosing.  Plan: DW 54.2kg  Vd 38L kei 0.022 hr-1  T1/2 32 hours Vancomycin 750 mg q 36 hours ordered with stacked dosing. Level before 4th dose. Goal trough 15-20.  Zosyn 3/375 grams q 8 hours ordered.   Height: 5\' 5"  (165.1 cm) Weight: 119 lb 6.4 oz (54.2 kg) IBW/kg (Calculated) : 57  Temp (24hrs), Avg:97.6 F (36.4 C), Min:97.1 F (36.2 C), Max:98.1 F (36.7 C)   Recent Labs Lab 03/21/16 1745  WBC 14.9*  CREATININE 1.64*    Estimated Creatinine Clearance: 20.7 mL/min (by C-G formula based on SCr of 1.64 mg/dL (H)).    Allergies  Allergen Reactions  . Sulfa Antibiotics     Antimicrobials this admission: ceftriaxone 1/22 >> vancomycin and Zosyn 1/23    Dose adjustments this admission:   Microbiology results: 1/22 UCx: pending  1/23 MRSA PCR: pending    1/22 UA: LE(+) NO2(-) WBC TNTC  Thank you for allowing pharmacy to be a part of this patient's care.  Anup Brigham S 03/22/2016 1:46 AM

## 2016-03-22 NOTE — Progress Notes (Addendum)
[facility] [place] NAME: Brandy Shepard    MR#:  [id number]  DATE OF BIRTH:  1926/07/10  SUBJECTIVE:  CHIEF COMPLAINT:   Chief Complaint  Patient presents with  . Altered Mental Status     Resident in nursing home with baseline dementia and wheelchair-bound for last 2 years. She does not eat enough and spit out her food at her baseline. For last 2 weeks gradually daughters noted she becoming more lethargic. She also had one-time episode of bleeding through her vagina, minor amount, and nursing home was planning to refer her to GYN clinic. Due to her worsening with allergy they spoke to the*repeatedly to check her urinalysis but they were reassured," she don't have UTI." Finally family decided to bring her to emergency room and noted having UTI, severe hyperglycemia, dehydration. Hyperglycemia and dehydration is better with IV fluid now. Patient is more alert and almost as baseline as per family. Due to her confusion sees constantly trying to pull out the IV line or the wires around her.  REVIEW OF SYSTEMS:   Unable to provide as patient is confused. ROS  DRUG ALLERGIES:   Allergies  Allergen Reactions  . Sulfa Antibiotics     VITALS:  Blood pressure (!) 144/56, pulse 71, temperature 99.2 F (37.3 C), temperature source Axillary, resp. rate 20, height 5\' 5"  (1.651 m), weight 54.2 kg (119 lb 6.4 oz), SpO2 96 %.  PHYSICAL EXAMINATION:  GENERAL:  83 y.o.-year-old patient lying in the bed with no acute distress.  EYES: Pupils equal, round, reactive to light and accommodation. No scleral icterus. Extraocular muscles intact.  HEENT: Head atraumatic, normocephalic. Oropharynx and nasopharynx clear.  NECK:  Supple, no jugular venous distention. No thyroid enlargement, no tenderness.  LUNGS: Normal breath sounds bilaterally, no wheezing, rales,rhonchi or crepitation. No use of accessory muscles of respiration.  CARDIOVASCULAR: S1, S2 normal.  Systolic murmurs,no rubs, or gallops.  ABDOMEN: Soft, nontender, nondistended. Bowel sounds present. No organomegaly or mass.  EXTREMITIES: No pedal edema, cyanosis, or clubbing.  NEUROLOGIC: Patient is alert but confused due to her baseline dementia, she is moving her upper limbs spontaneously but does not follow, and or make any meaningful communication. She keeps speaking some alphabets and some non-relavent words. PSYCHIATRIC: The patient is alert and not oriented x 3.  SKIN: No obvious rash, lesion.   Physical Exam LABORATORY PANEL:   CBC  Recent Labs Lab 03/22/16 0451  WBC 17.5*  HGB 11.6*  HCT 35.1  PLT 294   ------------------------------------------------------------------------------------------------------------------  Chemistries   Recent Labs Lab 03/21/16 1745 03/22/16 0451  NA 145 155*  K 4.0 3.3*  CL 107 120*  CO2 24 25  GLUCOSE 782* 301*  BUN 54* 43*  CREATININE 1.64* 1.08*  CALCIUM 9.7 9.3  MG  --  1.9  AST 37  --   ALT 18  --   ALKPHOS 140*  --   BILITOT 1.0  --    ------------------------------------------------------------------------------------------------------------------  Cardiac Enzymes No results for input(s): TROPONINI in the last 168 hours. ------------------------------------------------------------------------------------------------------------------  RADIOLOGY:  Ct Head Wo Contrast  Result Date: 03/21/2016 CLINICAL DATA:  Unresponsive x2 days with facial droop and slurred speech. EXAM: CT HEAD WITHOUT CONTRAST TECHNIQUE: Contiguous axial images were obtained from the base of the skull through the vertex without intravenous contrast. COMPARISON:  04/03/2013 CT FINDINGS: Brain: Mild superficial and moderate central atrophy with mild to moderate degree of chronic small vessel ischemic disease of periventricular white matter.  No acute intracranial hemorrhage, mass or midline shift. No extra-axial fluid collections. No acute large  vascular territory infarct. No effacement of the basal cisterns or fourth ventricle. Vascular: No hyperdense vessel or unexpected calcification. Moderate degree of carotid siphon calcifications bilaterally. Skull: Nonacute.  Mastoids are clear bilaterally. Sinuses/Orbits: Mucous retention cyst of the left maxillary sinus is partially imaged. Orbits are nonacute. Globes demonstrate bilateral lens replacements surgeries. Other: None IMPRESSION: Cerebral atrophy with chronic mild-to-moderate degree of small vessel ischemic disease involving the deep cerebral white matter. No acute intracranial abnormality. Electronically Signed   By: Ashley Royalty M.D.   On: 03/21/2016 20:11    ASSESSMENT AND PLAN:   Active Problems:   UTI (urinary tract infection)   Protein-calorie malnutrition, severe  * Altered mental status   Metabolic encephalopathy secondary to infection   Urinary tract infection    IV vancomycin and Zosyn started for now.   I will cut down on antibiotics so on. We will follow culture results.   Speech and swallow evaluation is requested on admission, follow recommendations.  * Dehydration   Acute kidney injury      IV fluid and repeation in morning shows correction.  * Hyperglycemia   Possibly secondary to infection, as per patient's daughter's she does not have diabetes.    blood sugar came down normal, hemoglobin A1c is in process.   But later again > 500, so now start on lantus and SCC.  * Dementia   Continue baseline medications.  All the records are reviewed and case discussed with Care Management/Social Workerr. Management plans discussed with the patient, family and they are in agreement.  CODE STATUS: full.  TOTAL TIME TAKING CARE OF THIS PATIENT: 35 minutes.   Patient's 2 daughters and one son in low present in the room, discussed with them about the, disease process and further options available. They understand and appreciate the plan but currently would like to  continue full code.  POSSIBLE D/C IN 1-2 DAYS, DEPENDING ON CLINICAL CONDITION.   Vaughan Basta M.D on 03/22/2016   Between 7am to 6pm - Pager - [phone]  After 6pm go to [email or web address] - password [email or web address] [facility]  Office  [phone]  CC: Primary care physician; Viviana Simpler, MD  Note: This dictation was prepared with Dragon dictation along with smaller phrase technology. Any transcriptional errors that result from this process are unintentional.

## 2016-03-22 NOTE — Progress Notes (Signed)
Lovenox changed to 30 mg daily for CrCl<30. 

## 2016-03-22 NOTE — Plan of Care (Signed)
Problem: SLP Dysphagia Goals Goal: Misc Dysphagia Goal Pt will tolerate least restricted diet considered safe without s/s of aspiration

## 2016-03-22 NOTE — Progress Notes (Signed)
Family Meeting Note  Advance Directive:yes  Today a meeting took place with the Patient and 2 daughters , son in law.  Patient is unable to participate due [id number] capacity demented.   The following clinical team members were present during this meeting:MD  The following were discussed:Patient's diagnosis: terminal dementia, decreased oral intake, Patient's progosis: Unable to determine and Goals for treatment: Full Code  Additional follow-up to be provided: palliative care   Time spent during discussion:20 minutes  Jentzen Minasyan, Rosalio Macadamia, MD

## 2016-03-22 NOTE — Evaluation (Signed)
Clinical/Bedside Swallow Evaluation Patient Details  Name: Brandy Shepard MRN: [id number] Date of Birth: 1926-09-12  Today's Date: 03/22/2016 Time: SLP Start Time (ACUTE ONLY): 53 SLP Stop Time (ACUTE ONLY): 1313 SLP Time Calculation (min) (ACUTE ONLY): 103 min  Past Medical History:  Past Medical History:  Diagnosis Date  . Alzheimer disease   . Atrial fibrillation ([place])   . Hypertension    Past Surgical History:  Past Surgical History:  Procedure Laterality Date  . ABDOMINAL HYSTERECTOMY    . CHOLECYSTECTOMY    . HIP FRACTURE SURGERY     HPI: Per admitting history and physical: Brandy Shepard is a 83 y.o. female with a known history of dementia, alzheimers, afib, HTN was in a usual state of health until about 4 days ago when the patient's family grew concerned about increased lethargy, decreased responsiveness, decreased by mouth intake.      Assessment / Plan / Recommendation Clinical Impression  Pt presents with moderate to severe dysphagia limiting ability to maintain nutrition. Per family, Pt has been chewing her food and then spitting it out with very little intake over the last few weeks. Pt was pleasant and cooperative but not able to follow directions. Extended time needed to assess Pts needs. Pt tolerated several sips of thin liquid without spitting out the liquid but did show occasional s/s of aspiration characterized by strong cough. Given bites of puree and solid consistencies, Pt masticated for an extended amount of time and eventually spit the food out. Pt did tolerate 4 sips of nectar thick liquid prior to eventually holding and then spitting that out as well. Use of assisted self feeding helped decrease bolus holding with liquid. Discussed risk of aspiration with thin liquids versus Pt spitting out most other consistencies. Rec trying a full liquid diet with thin liquids and a few thicker, smooth items. Pt is likely to hold and then spit out the thicker items,  therefore, the majority of her nutrition should come from the liquids. Will consult with Dietitian as Pt did well with Glucerna. Will also send some nectar thick liquids on each tray in the event that Pt. has difficutly with the thin liquids. Family has been educated on how to maximize intake during feeding. Pt will need feeding throughout the day as she is not likely going to consume enough during meals. Any oral meds will need to be crushed and given in liquid. ST to f/u with toleration of diet. If Pt shows more consistent s/s of aspiration, will need to alter diet to all thickened liquids. Rec ST eval and tx at discharge at SND to asiste with diet consistency and education for maximum nutrition and decreased risk of aspiration.    Aspiration Risk  Moderate aspiration risk;Risk for inadequate nutrition/hydration    Diet Recommendation Other (Comment) (Full liquid diet. Most of nutrtion needs to come from liquid)   Liquid Administration via: Cup;Straw Medication Administration:  (IV when possible. Oral meds crushed and given in liquid) Supervision: Full supervision/cueing for compensatory strategies;Staff to assist with self feeding Compensations: Small sips/bites;Minimize environmental distractions;Other (Comment) (Hand to hand assist with cup sips) Postural Changes: Seated upright at 90 degrees;Remain upright for at least 30 minutes after po intake    Other  Recommendations Oral Care Recommendations: Oral care BID   Follow up Recommendations Skilled Nursing facility      Frequency and Duration min 3x week          Prognosis Barriers to Reach Goals: Cognitive deficits;Severity of deficits  Swallow Study   General Date of Onset: 03/20/16 Type of Study: Bedside Swallow Evaluation Diet Prior to this Study: Regular Temperature Spikes Noted: No Respiratory Status: Room air History of Recent Intubation: No Behavior/Cognition: Alert;Distractible;Doesn't follow directions Oral Cavity  Assessment: Dried secretions Oral Care Completed by SLP: Yes Oral Cavity - Dentition: Poor condition Vision: Functional for self-feeding Self-Feeding Abilities: Total assist Patient Positioning: Upright in bed Baseline Vocal Quality: Normal Volitional Cough: Strong Volitional Swallow: Unable to elicit    Oral/Motor/Sensory Function Overall Oral Motor/Sensory Function: Moderate impairment   Ice Chips Ice chips: Within functional limits Presentation: Spoon   Thin Liquid Thin Liquid: Impaired Presentation: Cup;Straw Oral Phase Functional Implications: Right anterior spillage;Prolonged oral transit;Oral holding Pharyngeal  Phase Impairments: Other (comments) (cough 2 out of 12 sips)    Nectar Thick Nectar Thick Liquid: Impaired Presentation: Cup Oral phase functional implications: Oral holding;Prolonged oral transit   Honey Thick     Puree Puree: Impaired Presentation: Self Fed Oral Phase Functional Implications: Oral holding;Other (comment) (swallowed first bite but spit out all others)   Solid   GO   Solid: Impaired Presentation: Self Fed Oral Phase Functional Implications: Oral residue;Oral holding;Other (comment) (spit out cracker after chewing)        Lucila Maine 03/22/2016,1:20 PM

## 2016-03-22 NOTE — Progress Notes (Signed)
BG re-checked.  BG reading at 511.  MD notified.  Orders given to administer 8 Units STAT.  Will administer 8 Units and re-check in 1 hour.

## 2016-03-22 NOTE — Evaluation (Signed)
Occupational Therapy Evaluation Patient Details Name: Brandy Shepard MRN: [id number] DOB: 1926-04-27 Today's Date: 03/22/2016    History of Present Illness Per admitting history and physical: Brandy Shepard is a 83 y.o. female with a known history of dementia, alzheimers, afib, HTN was in a usual state of health until about 4 days ago when the patient's family grew concerned about increased lethargy, decreased responsiveness, decreased by mouth intake   Clinical Impression   Pt received for OT evaluation today with 2 daughters present for entirety of session. Pt leaning heavily to R side lying in bed requiring max ax2 to adjust upright positioning and HOB elevated in preparation for total assist self feeding a few sips of thin liquid by daughter adhering to speech therapist's instructions for aspiration precautions. Pt mumbling throughout, unable to follow commands, however very pleasant and held OT's hand and daughter's hand several times during session. Dementia care education provided, daughters acknowledged training provided through [facility] and palliative care which they found very valuable. Pt requires total assist for ADLs at baseline due to cognitive status. Family and OT agree no additional acute care OT needs at this time. Recommending return to SNF after hospitalization. OT will sign off.    Follow Up Recommendations  SNF    Equipment Recommendations       Recommendations for Other Services       Precautions / Restrictions Precautions Precautions: Fall;Other (comment) Precaution Comments: aspiration, MRSA Restrictions Weight Bearing Restrictions: No      Mobility Bed Mobility Overal bed mobility: Needs Assistance Bed Mobility: Supine to Sit     Supine to sit: +2 for physical assistance;Max assist;HOB elevated     General bed mobility comments: pt leaning to right side unable to correct on her own, required max A to help pt into upright posture    Transfers                 General transfer comment: did not attempt due to safety, PT entered at end of session to assess mobility    Balance Overall balance assessment: Needs assistance Sitting-balance support:  (bed level with HOB raised, was unable to maintain upright posture) Sitting balance-Leahy Scale: Poor                                      ADL Overall ADL's : Needs assistance/impaired;At baseline                                       General ADL Comments: Pt requires total assist for all ADL due to impaired cognitive status which is her baseline     Vision Additional Comments: likely with visual impairments due to cognitive status, difficult to assess   Perception     Praxis      Pertinent Vitals/Pain Pain Assessment: Faces Faces Pain Scale: No hurt     Hand Dominance     Extremity/Trunk Assessment Upper Extremity Assessment Upper Extremity Assessment: Difficult to assess due to impaired cognition   Lower Extremity Assessment Lower Extremity Assessment: Difficult to assess due to impaired cognition       Communication Communication Communication: Other (comment) (cognitive status)   Cognition Arousal/Alertness: Awake/alert Behavior During Therapy: Restless Overall Cognitive Status: History of cognitive impairments - at baseline  General Comments: unable to follow commands, answer questions, did appear to recognize daughters and held their hands   General Comments       Exercises   Other Exercises Other Exercises: daughters educated briefly in dementia care techniques (hand under hand, positive approach, opportunities for increasing quality of life based on pt interests) and both acknowledged training from Lutricia Horsfall at [facility] that was "incredibly valuable"   Shoulder Instructions      Home Living Family/patient expects to be discharged to:: Skilled nursing  facility                                 Additional Comments: pt dtr reports pt is resident of [facility]      Prior Functioning/Environment Level of Independence: Needs assistance  Gait / Transfers Assistance Needed: Max Ax2 for all transfers  ADL's / Homemaking Assistance Needed: total assist for all ADLs Communication / Swallowing Assistance Needed: mumbles, difficult to understand          OT Problem List: Decreased cognition;Decreased safety awareness;Impaired balance (sitting and/or standing);Decreased activity tolerance   OT Treatment/Interventions:      OT Goals(Current goals can be found in the care plan section) Acute Rehab OT Goals OT Goal Formulation: Patient unable to participate in goal setting  OT Frequency:     Barriers to D/C:            Co-evaluation              End of Session    Activity Tolerance: Other (comment) (treatment limited due to cognitive status) Patient left: in bed;with bed alarm set;with call bell/phone within reach;with family/visitor present;Other (comment) (with PT present for session)   Time: 1414-1440 OT Time Calculation (min): 26 min Charges:  OT General Charges $OT Visit: 1 Procedure OT Evaluation $OT Eval Moderate Complexity: 1 Procedure OT Treatments $Self Care/Home Management : 8-22 mins G-Codes:    Corky Sox, OTR/L 03/22/2016, 3:22 PM

## 2016-03-22 NOTE — Evaluation (Signed)
Physical Therapy Evaluation Patient Details Name: Brandy Shepard MRN: [id number] DOB: [birth year] Today's Date: 03/22/2016   History of Present Illness  Per admitting history and physical: Brandy Shepard is a 83 y.o. female with a known history of dementia, alzheimers, afib, HTN was in a usual state of health until about 4 days ago when the patient's family grew concerned about increased lethargy, decreased responsiveness, decreased by mouth intake  Clinical Impression  Pt is a pleasantly confused 83 year old female who was admitted for UTI. Pt performs bed mobility with max assist and able to sit at EOB for brief time. RN educated to continue to sit pt at bedside for meals and med pass. Per family, pt able to perform transfers in/out of [facility] with staff with stand pivot. Pt is high fall risk at this time secondary to balance deficits,strength, and mobility. Pt with impaired cognition, poor historian, most of history obtained from chart. Pt would benefit from trial of PT services to improve transfer ability and decrease fall risk. Would benefit from skilled PT to address above deficits and promote optimal return to PLOF; recommend transition to STR upon discharge from acute hospitalization.       Follow Up Recommendations SNF    Equipment Recommendations  None recommended by PT    Recommendations for Other Services       Precautions / Restrictions Precautions Precautions: Fall Precaution Comments: aspiration, MRSA Restrictions Weight Bearing Restrictions: No      Mobility  Bed Mobility Overal bed mobility: Needs Assistance Bed Mobility: Supine to Sit     Supine to sit: +2 for physical assistance;Max assist;HOB elevated     General bed mobility comments: Needs max assist for transfer, and leaning towards R side once seated at EOB. Able to somewhat self correct with practice. Able to maintain seated at EOB for approx 5 minutes prior to fatigue  Transfers                  General transfer comment: not safe to attempt at this time secondary to balance and cognition  Ambulation/Gait                Stairs            Wheelchair Mobility    Modified Rankin (Stroke Patients Only)       Balance Overall balance assessment: Needs assistance Sitting-balance support: Feet unsupported;Bilateral upper extremity supported Sitting balance-Leahy Scale: Poor                                       Pertinent Vitals/Pain Pain Assessment: No/denies pain Faces Pain Scale: No hurt    Home Living Family/patient expects to be discharged to:: Skilled nursing facility                 Additional Comments: pt dtr reports pt is resident of [facility]F    Prior Function Level of Independence: Needs assistance   Gait / Transfers Assistance Needed: Max Ax2 for all transfers   ADL's / Homemaking Assistance Needed: total assist for all ADLs  Comments: per daughters, patient needed assist for transfers to/from [facility]     Hand Dominance        Extremity/Trunk Assessment   Upper Extremity Assessment Upper Extremity Assessment: Difficult to assess due to impaired cognition (able to squeeze hands; generally unable to participate)    Lower Extremity Assessment  Lower Extremity Assessment: Generalized weakness (B LE grossly 3+/5)       Communication   Communication:  (cognitive status)  Cognition Arousal/Alertness: Awake/alert Behavior During Therapy: Restless Overall Cognitive Status: History of cognitive impairments - at baseline                 General Comments: unable to follow commands, answer questions, did appear to recognize daughters and held their hands    General Comments      Exercises Other Exercises Other Exercises: daughters educated briefly in dementia care techniques (hand under hand, positive approach, opportunities for increasing quality of life based on pt interests) and both acknowledged  training from Lutricia Horsfall at [facility] that was "incredibly valuable" Other Exercises: Attempted ther-ex with patient, able to perform B LE AROM including SLRs, heel slides, and hip abd/add. Able to perform some resisted motion, however limited. All ther-ex 5-10 reps. Unable to participate on B UE.   Assessment/Plan    PT Assessment Patient needs continued PT services  PT Problem List Decreased strength;Decreased activity tolerance;Decreased balance;Decreased mobility;Decreased cognition          PT Treatment Interventions DME instruction;Therapeutic exercise;Therapeutic activities;Balance training    PT Goals (Current goals can be found in the Care Plan section)  Acute Rehab PT Goals Patient Stated Goal: to go back to rehab PT Goal Formulation: Patient unable to participate in goal setting Time For Goal Achievement: 04/05/16 Potential to Achieve Goals: Fair    Frequency Min 2X/week   Barriers to discharge        Co-evaluation               End of Session   Activity Tolerance: Patient tolerated treatment well Patient left: in bed;with bed alarm set;with family/visitor present Nurse Communication: Mobility status         Time: [id number] PT Time Calculation (min) (ACUTE ONLY): 26 min   Charges:   PT Evaluation $PT Eval Moderate Complexity: 1 Procedure PT Treatments $Therapeutic Exercise: 8-22 mins   PT G Codes:        Audry Kauzlarich 03/29/16, 4:35 PM Greggory Stallion, PT, DPT [phone]

## 2016-03-22 NOTE — Clinical Social Work Note (Deleted)
CSW is aware that patient is a long term resident at [facility]. CSW has spoken to [place] at [facility] and patient can return at discharge. FL2 sent for signature. CSW assessment to be completed. Shela Leff MSW,LCSW [phone]

## 2016-03-22 NOTE — Progress Notes (Signed)
Inpatient Diabetes Program Recommendations  AACE/ADA: New Consensus Statement on Inpatient Glycemic Control (2015)  Target Ranges:  Prepandial:   less than 140 mg/dL      Peak postprandial:   less than 180 mg/dL (1-2 hours)      Critically ill patients:  140 - 180 mg/dL   Lab Results  Component Value Date   GLUCAP 276 (H) 03/22/2016    Review of Glycemic Control  Results for CHING, LAWRENZ (MRN [id number]) as of 03/22/2016 10:56  Ref. Range 03/21/2016 20:47 03/21/2016 23:35 03/22/2016 00:20 03/22/2016 08:08  Glucose-Capillary Latest Ref Range: 65 - 99 mg/dL 549 (HH) 371 (H) 339 (H) 276 (H)    Diabetes history: none noted Outpatient Diabetes medications: none Current orders for Inpatient glycemic control: Novolog 0-9 units tid, Novolog 0-5 units qhs  Inpatient Diabetes Program Recommendations:  Consider  low dose basal insulin Lantus 10 units qhs (0.2unit/kg)  Gentry Fitz, RN, BA, [place], CDE Diabetes Coordinator Inpatient Diabetes Program  [phone] (Team Pager) [phone] ([place]) 03/22/2016 11:00 AM

## 2016-03-22 NOTE — Progress Notes (Addendum)
Initial Nutrition Assessment  DOCUMENTATION CODES:   Severe malnutrition in context of acute illness/injury  INTERVENTION:  -Recommend feeding assistance at meal times -Recommend Magic Cup on meal trays -Recommend Boost Breeze between meals if appropriate for diet consistency post SLP -Coordination of Care: discussed pt with SLP, SLP has been consulted and plan to evaluate today -Pt hypernatremic, on NS at 75 ml/hr; recommend providing free water  NUTRITION DIAGNOSIS:   Malnutrition related to acute illness as evidenced by energy intake < or equal to 50% for > or equal to 5 days, percent weight loss.  GOAL:   Patient will meet greater than or equal to 90% of their needs  MONITOR:   PO intake, Supplement acceptance, Labs, Weight trends  REASON FOR ASSESSMENT:   Malnutrition Screening Tool    ASSESSMENT:    83 yo female admitted with AMS secondary to dehydration and infection with UTI, sacral decub infection. Pt with hx of dementia, HTN, afib  Pt sleeping on visit today, family at bedside and requested that writer not wake pt.    Unable to complete Nutrition-Focused physical exam at this time. Pt requesting writer to not disturb pt as pt is resting.  Family reports that pt has not been tolerating diet (regular with thin liquids) at [facility]; pt has been chewing and spitting out food or holding food in mouth. Family reports this has been going on for months. Pt does tolerate liquids, loves iced tea and juice and has been drinking nutritional supplements (initially pt on Ensure but they report it gave her diarrhea, she was now on a CL supplement similar to [employer]). Family also report that pt is unable to use utensils and instead uses her hands to feed herself.   Pt with poor oral hygiene (does not brush her teeth or let staff brush her teeth) and now with poor dentition after several teeth have fallen out over the past several months.   SLP to evaluate pt today,  discussed above with SLP  Family also report wt loss; pt weighed 135 pounds, current wt 119 pounds. 11.9% wt loss in 2 months.   Labs: sodium 155, potassium 3.3, phosphorus 2.2, magnesium wdl, Creatinine 1.08, FSBS 276-549 Meds: NS at 75 ml/hr, ss novolog  Diet Order:  DIET - DYS 1 Room service appropriate? Yes with Assist; Fluid consistency: Thin  Skin:   (Incontinence Associated Dermatitis (IAD) with a fungal component to buttocks and perineal area)  Last BM:  no documented BM  Height:   Ht Readings from Last 1 Encounters:  03/21/16 5\' 5"  (1.651 m)    Weight:   Wt Readings from Last 1 Encounters:  03/21/16 119 lb 6.4 oz (54.2 kg)    BMI:  Body mass index is 19.87 kg/m.  Estimated Nutritional Needs:   Kcal:  1350-1620 kcals  Protein:  68-80 g  Fluid:  >/= 1.5 L  EDUCATION NEEDS:   No education needs identified at this time [place], [place], [place] [phone] Pager  [phone] Weekend/On-Call Pager

## 2016-03-22 NOTE — Progress Notes (Signed)
MD notified of BG 441.  Orders to administer 12 Units and re-check  BG in 1 hour.

## 2016-03-23 LAB — CBC
HEMATOCRIT: 35.5 % (ref 35.0–47.0)
HEMOGLOBIN: 11.6 g/dL — AB (ref 12.0–16.0)
MCH: 31 pg (ref 26.0–34.0)
MCHC: 32.6 g/dL (ref 32.0–36.0)
MCV: 95.1 fL (ref 80.0–100.0)
PLATELETS: 238 10*3/uL (ref 150–440)
RBC: 3.73 MIL/uL — AB (ref 3.80–5.20)
RDW: 14.5 % (ref 11.5–14.5)
WBC: 14.3 10*3/uL — AB (ref 3.6–11.0)

## 2016-03-23 LAB — GLUCOSE, CAPILLARY
GLUCOSE-CAPILLARY: 162 mg/dL — AB (ref 65–99)
GLUCOSE-CAPILLARY: 91 mg/dL (ref 65–99)

## 2016-03-23 LAB — BLOOD GAS, VENOUS
Acid-base deficit: 0.7 mmol/L (ref 0.0–2.0)
Bicarbonate: 25.8 mmol/L (ref 20.0–28.0)
PATIENT TEMPERATURE: 37
PCO2 VEN: 49 mmHg (ref 44.0–60.0)
pH, Ven: 7.33 (ref 7.250–7.430)

## 2016-03-23 LAB — BASIC METABOLIC PANEL
ANION GAP: 10 (ref 5–15)
BUN: 35 mg/dL — ABNORMAL HIGH (ref 6–20)
CHLORIDE: 124 mmol/L — AB (ref 101–111)
CO2: 23 mmol/L (ref 22–32)
Calcium: 8.8 mg/dL — ABNORMAL LOW (ref 8.9–10.3)
Creatinine, Ser: 1.16 mg/dL — ABNORMAL HIGH (ref 0.44–1.00)
GFR calc Af Amer: 48 mL/min — ABNORMAL LOW (ref >60)
GFR, EST NON AFRICAN AMERICAN: 41 mL/min — AB (ref >60)
GLUCOSE: 164 mg/dL — AB (ref 65–99)
POTASSIUM: 2.7 mmol/L — AB (ref 3.5–5.1)
Sodium: 157 mmol/L — ABNORMAL HIGH (ref 135–145)

## 2016-03-23 LAB — MAGNESIUM: MAGNESIUM: 1.7 mg/dL (ref 1.7–2.4)

## 2016-03-23 LAB — HEPATIC FUNCTION PANEL
ALT: 19 U/L (ref 14–54)
AST: 47 U/L — ABNORMAL HIGH (ref 15–41)
Albumin: 2.8 g/dL — ABNORMAL LOW (ref 3.5–5.0)
Alkaline Phosphatase: 92 U/L (ref 38–126)
BILIRUBIN TOTAL: 0.7 mg/dL (ref 0.3–1.2)
Total Protein: 6.3 g/dL — ABNORMAL LOW (ref 6.5–8.1)

## 2016-03-23 LAB — HEMOGLOBIN A1C
HEMOGLOBIN A1C: 11.2 % — AB (ref 4.8–5.6)
MEAN PLASMA GLUCOSE: 275 mg/dL

## 2016-03-23 MED ORDER — ASPIRIN 81 MG PO TBEC: 81.0000 mg | DELAYED_RELEASE_TABLET | Freq: Every day | ORAL | 0 refills | Status: AC

## 2016-03-23 MED ORDER — ENOXAPARIN SODIUM 30 MG/0.3ML ~~LOC~~ SOLN: 30.0000 mg | SUBCUTANEOUS | Status: DC

## 2016-03-23 MED ORDER — POTASSIUM CHLORIDE CRYS ER 10 MEQ PO TBCR
30.0000 meq | EXTENDED_RELEASE_TABLET | ORAL | Status: DC
  Filled 2016-03-23: qty 1

## 2016-03-23 MED ORDER — POTASSIUM CHLORIDE 20 MEQ/15ML (10%) PO SOLN: 20.0000 meq | Freq: Every day | ORAL | 0 refills | Status: AC

## 2016-03-23 MED ORDER — SODIUM CHLORIDE 0.9 % IV SOLN
30.0000 meq | Freq: Once | INTRAVENOUS | Status: DC
  Filled 2016-03-23: qty 15

## 2016-03-23 MED ORDER — SODIUM CHLORIDE 0.9 % IV SOLN
30.0000 meq | Freq: Once | INTRAVENOUS | Status: AC
  Administered 2016-03-23: 30 meq via INTRAVENOUS
  Filled 2016-03-23: qty 15

## 2016-03-23 MED ORDER — CEFUROXIME AXETIL 250 MG/5ML PO SUSR: 250.0000 mg | Freq: Two times a day (BID) | ORAL | 0 refills | Status: AC

## 2016-03-23 NOTE — Progress Notes (Signed)
Per Dr. Molli Hazard hold lantus as pt is not eating at this time. Will monitor

## 2016-03-23 NOTE — Progress Notes (Signed)
Pt Alert. VSS. Pt tolerating diet well. No complaints of pain or nausea. IV removed intact,no new prescriptions given. Pts family voiced understanding of discharge instructions with no further questions. Pt discharged via EMS to [facility]. Report called to [facility].

## 2016-03-23 NOTE — Progress Notes (Signed)
Chart reviewed, Pt visited. Pt lethargic not appropriate for Po's yet this am. Unsuccessful with PO meds. per daughters. Pt tolerated sips of thin liquid and a few bites of the full liquid items on tray last night per daughters without s/s of aspiration. Starting spitting food out after a few bites. ST trained family on safest and most effective feeding methods yesterday. Will continue with current diet and assist/ alter as needed. ST to follow up 1-2 days

## 2016-03-23 NOTE — Progress Notes (Signed)
PHARMACIST - PHYSICIAN COMMUNICATION  CONCERNING:  Enoxaparin (Lovenox) for DVT Prophylaxis    RECOMMENDATION: Patient was prescribed enoxaprin 40mg  q24 hours for VTE prophylaxis.   Filed Weights   03/21/16 1745 03/21/16 2356  Weight: 105 lb (47.6 kg) 119 lb 6.4 oz (54.2 kg)    Body mass index is 19.87 kg/m.  Estimated Creatinine Clearance: 29.2 mL/min (by C-G formula based on SCr of 1.16 mg/dL (H)).  Patient is candidate for enoxaparin 30mg  every 24 hours based on CrCl <78ml/min or Weight less then 45kg for female and 50kg for female  DESCRIPTION: Pharmacy has adjusted enoxaparin dose.  Patient is now receiving enoxaparin 30mg  every 24 hours.  Nancy Fetter, PharmD Clinical Pharmacist  03/23/2016 1:16 PM

## 2016-03-23 NOTE — Progress Notes (Addendum)
[place] for electrolyte management  Indication: Hypokalemia    Patient Measurements: Height: 5\' 5"  (165.1 cm) Weight: 119 lb 6.4 oz (54.2 kg) IBW/kg (Calculated) : 57  Vital Signs: Temp: 98.3 F (36.8 C) (01/24 0638) Temp Source: Oral (01/24 [id number]) BP: 173/58 (01/24 [id number]) Pulse Rate: 73 (01/24 0638)    Recent Labs  03/21/16 1745 03/22/16 0451 03/23/16 0449  WBC 14.9* 17.5* 14.3*  HGB 12.7 11.6* 11.6*  HCT 39.1 35.1 35.5  PLT 338 294 238  CREATININE 1.64* 1.08* 1.16*  MG  --  1.9  --   PHOS  --  2.2*  --   ALBUMIN 3.9  --   --   PROT 8.5*  --   --   AST 37  --   --   ALT 18  --   --   ALKPHOS 140*  --   --   BILITOT 1.0  --   --    Estimated Creatinine Clearance: 29.2 mL/min (by C-G formula based on SCr of 1.16 mg/dL (H)).   Recent Labs  03/22/16 1759 03/22/16 1927 03/22/16 2126  GLUCAP 511* 309* 120*    Assessment: 82 yo female with hyperglycemia and Hypokalemia. Pharmacy consulted to manage patients hypokalemia.   1/24 0449: K+ 2.7  Goal of Therapy:  K+: 3.5-5  Plan:  Will order KCL 86mEq IV x1 and KCL 37mEq PO x 2 doses. Will recheck K+ this evening at 1800.  Mg level ordered. Will F/U on level and ask about replacement if needed.   Addendum: Patient is not swallowing. Will discontinue PO KCL orders and order another KCL 49mEq IV infusion x 1.   Pernell Dupre, PharmD, BCPS Clinical Pharmacist 03/23/2016 8:01 AM

## 2016-03-23 NOTE — Progress Notes (Signed)
Per Dr. Anselm Jungling okay to hold oral meds as pt is not swallowing and will be discharging with hospice.

## 2016-03-23 NOTE — Progress Notes (Signed)
Family Meeting Note  Advance Directive:yes  Today a meeting took place with the 2 daughters.  Patient is unable to participate due [id number] capacity dementia, altered mental status   The following clinical team members were present during this meeting:MD  The following were discussed:Patient's diagnosis: terminal dementia, Decreased oral intake , Patient's progosis: < 6 months and Goals for treatment: DNR Hospice care at [facility]  Speech and swallow evaluation is requested on admission, as per them pt have poor ability to eat.   She have terminal dementia,and I fear- she will ever able to have her enough nutrition without a feeding tube.   She may also get recurrent dehydration and infection as a result of that.   After long discussion with family with her daughters- they choose hospice in this situation.   They want her to be DNR and " go peacefully."   We will d/c her back to her NH with Hospice care there.  Additional follow-up to be provided: hospice to follow at [facility].  Time spent during discussion:20 minutes  Donita Newland, Rosalio Macadamia, MD

## 2016-03-23 NOTE — Discharge Summary (Signed)
[facility] NAME: Brandy Shepard    MR#:  [id number]  DATE OF BIRTH:  [birth year]  DATE OF ADMISSION:  03/21/2016 ADMITTING PHYSICIAN: Harvie Bridge, DO  DATE OF DISCHARGE: 03/23/2016  PRIMARY CARE PHYSICIAN: Viviana Simpler, MD    ADMISSION DIAGNOSIS:  Hyperglycemia [R73.9] Urinary tract infection without hematuria, site unspecified [N39.0] Altered mental status, unspecified altered mental status type [R41.82]  DISCHARGE DIAGNOSIS:  Active Problems:   UTI (urinary tract infection)   Protein-calorie malnutrition, severe   Dehydration.  SECONDARY DIAGNOSIS:   Past Medical History:  Diagnosis Date  . Alzheimer disease   . Atrial fibrillation ([place])   . Hypertension     HOSPITAL COURSE:   * Altered mental status   Metabolic encephalopathy secondary to infection   Urinary tract infection    IV vancomycin and Zosyn started for now.  Cx have 30 K E coli, Switch to oral Cefuroxime.   Speech and swallow evaluation is requested on admission, as per them pt have poor ability to eat.   She have terminal dementia,and I fear- she will ever able to have her enough nutrition without a feeding tube.   She may also get recurrent dehydration and infection as a result of that.   After long discussion with family with her daughters- they choose hospice in this situation.   They want her to be DNR and " go peacefully."   We will d/c her back to her NH with Hospice care there.  * Dehydration   Acute kidney injury      IV fluid and repeation in morning shows correction.  * Hyperglycemia   Possibly secondary to infection, as per patient's daughter's she does not have diabetes.    blood sugar came down normal, hemoglobin A1c is in process.   But later again > 500, so now start on lantus and SCC.   As she will have unpredictable oral intake- I will suggest not to use any antidiabetic medicines.  * Dementia   Continue  baseline medications.   DISCHARGE CONDITIONS:   Stable.  CONSULTS OBTAINED:    DRUG ALLERGIES:   Allergies  Allergen Reactions  . Sulfa Antibiotics     DISCHARGE MEDICATIONS:   Current Discharge Medication List    START taking these medications   Details  aspirin EC 81 MG EC tablet Take 1 tablet (81 mg total) by mouth daily. Qty: 30 tablet, Refills: 0    cefUROXime (CEFTIN) 250 MG/5ML suspension Take 5 mLs (250 mg total) by mouth 2 (two) times daily. Qty: 100 mL, Refills: 0    potassium chloride 20 MEQ/15ML (10%) SOLN Take 15 mLs (20 mEq total) by mouth daily. Qty: 450 mL, Refills: 0      CONTINUE these medications which have NOT CHANGED   Details  acetaminophen (TYLENOL) 325 MG tablet Take 650 mg by mouth 2 (two) times daily.    bisacodyl (DULCOLAX) 5 MG EC tablet Take 10 mg by mouth daily as needed for moderate constipation.    Docosanol (ABREVA EX) Apply topically.    hydrocortisone 2.5 % cream Apply topically 2 (two) times daily.    Infant Care Products [facility] EX) Apply topically.    loperamide (IMODIUM A-D) 2 MG tablet Take 2 mg by mouth 4 (four) times daily as needed for diarrhea or loose stools.    magnesium hydroxide (MILK OF MAGNESIA) 400 MG/5ML suspension Take 30 mLs by mouth daily as needed for mild constipation.  Nutritional Supplements (ENSURE CLEAR) LIQD Take 1 Can by mouth 2 (two) times daily.    nystatin (NYSTATIN) powder Apply 1 g topically 2 (two) times daily.    polyvinyl alcohol (LIQUIFILM TEARS) 1.4 % ophthalmic solution 1 drop as needed for dry eyes.    QUEtiapine (SEROQUEL) 25 MG tablet Take 25 mg by mouth 2 (two) times daily.    sodium chloride (OCEAN) 0.65 % SOLN nasal spray Place 1 spray into both nostrils as needed for congestion.    Vitamin D, Ergocalciferol, (DRISDOL) 50000 units CAPS capsule Take 50,000 Units by mouth every 7 (seven) days.      STOP taking these medications     bisoprolol (ZEBETA) 5 MG tablet       gabapentin (NEURONTIN) 100 MG capsule      omeprazole (PRILOSEC) 20 MG capsule          DISCHARGE INSTRUCTIONS:    To be followed by hospice at [facility].  If you experience worsening of your admission symptoms, develop shortness of breath, life threatening emergency, suicidal or homicidal thoughts you must seek medical attention immediately by calling 911 or calling your MD immediately  if symptoms less severe.  You Must read complete instructions/literature along with all the possible adverse reactions/side effects for all the Medicines you take and that have been prescribed to you. Take any new Medicines after you have completely understood and accept all the possible adverse reactions/side effects.   Please note  You were cared for by a hospitalist during your hospital stay. If you have any questions about your discharge medications or the care you received while you were in the hospital after you are discharged, you can call the unit and asked to speak with the hospitalist on call if the hospitalist that took care of you is not available. Once you are discharged, your primary care physician will handle any further medical issues. Please note that NO REFILLS for any discharge medications will be authorized once you are discharged, as it is imperative that you return to your primary care physician (or establish a relationship with a primary care physician if you do not have one) for your aftercare needs so that they can reassess your need for medications and monitor your lab values.    Today   CHIEF COMPLAINT:   Chief Complaint  Patient presents with  . Altered Mental Status    HISTORY OF PRESENT ILLNESS:  Brandy Shepard  is a 83 y.o. female with a known history of dementia, alzheimers, afib, HTN was in a usual state of health until about 4 days ago when the patient's family grew concerned about increased lethargy, decreased responsiveness, decreased by mouth intake. Patient's family  became concerned for infection and insisted that she be transferred for additional evaluation. Patient's daughter states that at baseline she is awake and alert, mumbles incoherently, however she is mostly bedbound or sits in a chair.  Patient's family are also concerned about possibility of a left-sided facial droop and thinks she may have had a stroke.   Otherwise there has been no change in status. Patient has been taking medication as prescribed and there has been no recent change in medication or diet.  No recent antibiotics.  There has been no recent illness, hospitalizations, travel or sick contacts.    Patient denies fevers/chills, weakness, dizziness, chest pain, shortness of breath, N/V/C/D, abdominal pain, dysuria/frequency, changes in mental status.    VITAL SIGNS:  Blood pressure 102/79, pulse 65, temperature 98.5 F (36.9  C), temperature source Axillary, resp. rate 16, height 5\' 5"  (1.651 m), weight 54.2 kg (119 lb 6.4 oz), SpO2 97 %.  I/O:   Intake/Output Summary (Last 24 hours) at 03/23/16 1112 Last data filed at 03/23/16 0740  Gross per 24 hour  Intake             2255 ml  Output                0 ml  Net             2255 ml    PHYSICAL EXAMINATION:  GENERAL:  83 y.o.-year-old patient lying in the bed with no acute distress.  EYES: Pupils equal, round, reactive to light and accommodation. No scleral icterus. Extraocular muscles intact.  HEENT: Head atraumatic, normocephalic. Oropharynx and nasopharynx clear.  NECK:  Supple, no jugular venous distention. No thyroid enlargement, no tenderness.  LUNGS: Normal breath sounds bilaterally, no wheezing, rales,rhonchi or crepitation. No use of accessory muscles of respiration.  CARDIOVASCULAR: S1, S2 normal. Systolic murmurs,no rubs, or gallops.  ABDOMEN: Soft, nontender, nondistended. Bowel sounds present. No organomegaly or mass.  EXTREMITIES: No pedal edema, cyanosis, or clubbing.  NEUROLOGIC: Patient is alert but confused  due to her baseline dementia, she is moving her upper limbs spontaneously but does not follow, and or make any meaningful communication. She keeps speaking some alphabets and some non-relavent words. PSYCHIATRIC: The patient is alert and not oriented x 3.  SKIN: No obvious rash, lesion.    DATA REVIEW:   CBC  Recent Labs Lab 03/23/16 0449  WBC 14.3*  HGB 11.6*  HCT 35.5  PLT 238    Chemistries   Recent Labs Lab 03/23/16 0449  NA 157*  K 2.7*  CL 124*  CO2 23  GLUCOSE 164*  BUN 35*  CREATININE 1.16*  CALCIUM 8.8*  MG 1.7  AST 47*  ALT 19  ALKPHOS 92  BILITOT 0.7    Cardiac Enzymes No results for input(s): TROPONINI in the last 168 hours.  Microbiology Results  Results for orders placed or performed during the hospital encounter of 03/21/16  Urine culture     Status: Abnormal (Preliminary result)   Collection Time: 03/21/16  5:56 PM  Result Value Ref Range Status   Specimen Description URINE, RANDOM  Final   Special Requests NONE  Final   Culture 30,000 COLONIES/mL ESCHERICHIA COLI (A)  Final   Report Status PENDING  Incomplete  MRSA PCR Screening     Status: Abnormal   Collection Time: 03/22/16  1:34 AM  Result Value Ref Range Status   MRSA by PCR POSITIVE (A) NEGATIVE Final    Comment:        The GeneXpert MRSA Assay (FDA approved for NASAL specimens only), is one component of a comprehensive MRSA colonization surveillance program. It is not intended to diagnose MRSA infection nor to guide or monitor treatment for MRSA infections. RESULT CALLED TO, READ BACK BY AND VERIFIED WITH: STACEY CLAY @ [id number] 03/22/2016 BY CAF     RADIOLOGY:  Ct Head Wo Contrast  Result Date: 03/21/2016 CLINICAL DATA:  Unresponsive x2 days with facial droop and slurred speech. EXAM: CT HEAD WITHOUT CONTRAST TECHNIQUE: Contiguous axial images were obtained from the base of the skull through the vertex without intravenous contrast. COMPARISON:  04/03/2013 CT FINDINGS: Brain:  Mild superficial and moderate central atrophy with mild to moderate degree of chronic small vessel ischemic disease of periventricular white matter. No acute intracranial hemorrhage, mass or  midline shift. No extra-axial fluid collections. No acute large vascular territory infarct. No effacement of the basal cisterns or fourth ventricle. Vascular: No hyperdense vessel or unexpected calcification. Moderate degree of carotid siphon calcifications bilaterally. Skull: Nonacute.  Mastoids are clear bilaterally. Sinuses/Orbits: Mucous retention cyst of the left maxillary sinus is partially imaged. Orbits are nonacute. Globes demonstrate bilateral lens replacements surgeries. Other: None IMPRESSION: Cerebral atrophy with chronic mild-to-moderate degree of small vessel ischemic disease involving the deep cerebral white matter. No acute intracranial abnormality. Electronically Signed   By: Ashley Royalty M.D.   On: 03/21/2016 20:11    EKG:   Orders placed or performed during the hospital encounter of 03/21/16  . EKG 12-Lead      Management plans discussed with the patient, family and they are in agreement.  CODE STATUS:  DNR    Code Status Orders        Start     Ordered   03/22/16 0021  Full code  Continuous     03/22/16 0021    Code Status History    Date Active Date Inactive Code Status Order ID Comments User Context   This patient has a current code status but no historical code status.    Advance Directive Documentation   [place] Most Recent Value  Type of Advance Directive  Living will  Pre-existing out of facility DNR order (yellow form or pink MOST form)  No data  "MOST" Form in Place?  No data      TOTAL TIME TAKING CARE OF THIS PATIENT: 35 minutes.    Vaughan Basta M.D on 03/23/2016 at 11:12 AM  Between 7am to 6pm - Pager - [phone]  After 6pm go to [email or web address] - password [email or web address] [place] [facility]  Office  [phone]  CC: Primary care  physician; Viviana Simpler, MD   Note: This dictation was prepared with Dragon dictation along with smaller phrase technology. Any transcriptional errors that result from this process are unintentional.

## 2016-03-23 NOTE — Discharge Instructions (Signed)
Hyperglycemia Hyperglycemia is when the sugar (glucose) level in your blood is too high. It may not cause symptoms. If you do have symptoms, they may include warning signs, such as:  Feeling more thirsty than normal.  Hunger.  Feeling tired.  Needing to pee (urinate) more than normal.  Blurry eyesight (vision). You may get other symptoms as it gets worse, such as:  Dry mouth.  Not being hungry (loss of appetite).  Fruity-smelling breath.  Weakness.  Weight gain or loss that is not planned. Weight loss may be fast.  A tingling or numb feeling in your hands or feet.  Headache.  Skin that does not bounce back quickly when it is lightly pinched and released (poor skin turgor).  Pain in your belly (abdomen).  Cuts or bruises that heal slowly. High blood sugar can happen to people who do or do not have diabetes. High blood sugar can happen slowly or quickly, and it can be an emergency. Follow these instructions at home: General instructions  Take over-the-counter and prescription medicines only as told by your doctor.  Do not use products that contain nicotine or tobacco, such as cigarettes and e-cigarettes. If you need help quitting, ask your doctor.  Limit alcohol intake to no more than 1 drink per day for nonpregnant women and 2 drinks per day for men. One drink equals 12 oz of beer, 5 oz of wine, or 1 oz of hard liquor.  Manage stress. If you need help with this, ask your doctor.  Keep all follow-up visits as told by your doctor. This is important. Eating and drinking  Stay at a healthy weight.  Exercise regularly, as told by your doctor.  Drink enough fluid, especially when you:  Exercise.  Get sick.  Are in hot temperatures.  Eat healthy foods, such as:  Low-fat (lean) proteins.  Complex carbs (complex carbohydrates), such as whole wheat bread or brown rice.  Fresh fruits and vegetables.  Low-fat dairy products.  Healthy fats.  Drink enough  fluid to keep your pee (urine) clear or pale yellow. If you have diabetes:  Make sure you know the symptoms of hyperglycemia.  Follow your diabetes management plan, as told by your doctor. Make sure you:  Take insulin and medicines as told.  Follow your exercise plan.  Follow your meal plan. Eat on time. Do not skip meals.  Check your blood sugar as often as told. Make sure to check before and after exercise. If you exercise longer or in a different way than you normally do, check your blood sugar more often.  Follow your sick day plan whenever you cannot eat or drink normally. Make this plan ahead of time with your doctor.  Share your diabetes management plan with people in your workplace, school, and household.  Check your urine for ketones when you are ill and as told by your doctor.  Carry a card or wear jewelry that says that you have diabetes. Contact a doctor if:  Your blood sugar level is higher than 240 mg/dL (13.3 mmol/L) for 2 days in a row.  You have problems keeping your blood sugar in your target range.  High blood sugar happens often for you. Get help right away if:  You have trouble breathing.  You have a change in how you think, feel, or act (mental status).  You feel sick to your stomach (nauseous), and that feeling does not go away.  You cannot stop throwing up (vomiting). These symptoms may be  an emergency. Do not wait to see if the symptoms will go away. Get medical help right away. Call your local emergency services (911 in the [place] Do not drive yourself to the hospital.  Summary  Hyperglycemia is when the sugar (glucose) level in your blood is too high.  High blood sugar can happen to people who do or do not have diabetes.  Make sure you drink enough fluids, eat healthy foods, and exercise regularly.  Contact your doctor if you have problems keeping your blood sugar in your target range. This information is not intended to replace advice given  to you by your health care provider. Make sure you discuss any questions you have with your health care provider. Document Released: 12/12/2008 Document Revised: 11/02/2015 Document Reviewed: 11/02/2015 Elsevier Interactive Patient Education  2017 [place] team to follow at SNF.

## 2016-03-23 NOTE — Progress Notes (Addendum)
Inpatient Diabetes Program Recommendations  AACE/ADA: New Consensus Statement on Inpatient Glycemic Control (2015)  Target Ranges:  Prepandial:   less than 140 mg/dL      Peak postprandial:   less than 180 mg/dL (1-2 hours)      Critically ill patients:  140 - 180 mg/dL   Results for Brandy Shepard, Brandy Shepard (MRN [id number]) as of 03/23/2016 11:22  Ref. Range 03/22/2016 00:20 03/22/2016 08:08 03/22/2016 11:40 03/22/2016 16:56 03/22/2016 17:59 03/22/2016 19:27 03/22/2016 21:26 03/23/2016 08:06  Glucose-Capillary Latest Ref Range: 65 - 99 mg/dL 339 (H) 276 (H) 163 (H) 441 (H) 511 (HH) 309 (H) 120 (H) 162 (H)  Results for Brandy Shepard, Brandy Shepard (MRN [id number]) as of 03/23/2016 11:22  Ref. Range 03/22/2016 04:51  Hemoglobin A1C Latest Ref Range: 4.8 - 5.6 % 11.2 (H)   Results for Brandy Shepard, Brandy Shepard (MRN [id number]) as of 03/23/2016 11:22  Ref. Range 03/21/2016 17:45  Glucose Latest Ref Range: 65 - 99 mg/dL 782 (HH)   Review of Glycemic Control  Diabetes history: No Outpatient Diabetes medications: NA Current orders for Inpatient glycemic control: Lantus 20 units daily, Novolog 0-9 units TID with meals, Novolog 0-5 units QHS  Inpatient Diabetes Program Recommendations:  Insulin - Basal: In reviewing chart, noted patient received Lantus 20 units on 03/22/16 at 22:15 and Lantus 20 units daily order for today has been charted as NOT GIVEN (hold per MD). Recommend patient receive Lantus 20 units at bedtime starting tonight. Therefore, please change frequency of Lantus 20 units to QHS (if appropriate for patient situation). Insulin-Correction: If patient continues to have poor PO intake, please change frequency of CBGs and Novolog to Q4H (if appropriate for patient situation). HgbA1C: A1C 11.2% on 03/22/16 indicating an average glucose of 275 mg/dl over the past 2-3 months.   NOTE: No prior history of DM; initial glucose 782 mg/dl on 03/21/16 and A1C 11.2% on 03/22/16. Per American Diabetes Association, if A1C is greater than  6.5% criteria is met to dx with DM. Since patient received Lantus 20 units last night, recommend changing frequency of Lantus to QHS to start tonight. Basal insulin should be given even if patient is NPO.  Thanks, Barnie Alderman, RN, MSN, CDE Diabetes Coordinator Inpatient Diabetes Program [phone] (Team Pager from 8am to 5pm)

## 2016-03-23 NOTE — Care Management Important Message (Signed)
Important Message  Patient Details  Name: YOMAIRA OFFUTT MRN: [id number] Date of Birth: November 26, 1926   Medicare Important Message Given:  N/A - LOS <3 / Initial given by admissions    Beverly Sessions, RN 03/23/2016, 12:28 PM

## 2016-03-24 DIAGNOSIS — R410 Disorientation, unspecified: Secondary | ICD-10-CM

## 2016-03-24 DIAGNOSIS — I480 Paroxysmal atrial fibrillation: Secondary | ICD-10-CM

## 2016-03-24 DIAGNOSIS — G301 Alzheimer's disease with late onset: Secondary | ICD-10-CM

## 2016-03-24 DIAGNOSIS — E119 Type 2 diabetes mellitus without complications: Secondary | ICD-10-CM | POA: Diagnosis not present

## 2016-03-24 LAB — URINE CULTURE: Culture: 30000 — AB

## 2016-03-26 ENCOUNTER — Other Ambulatory Visit: Payer: Self-pay | Admitting: Family Medicine

## 2016-03-26 MED ORDER — MORPHINE SULFATE (CONCENTRATE) 20 MG/ML PO SOLN: ORAL | 0 refills | Status: AC

## 2016-03-26 NOTE — Progress Notes (Signed)
I received a call from [facility] regarding the patient who is on Hospice but without order sets signed currently. They requested end of life medications, and I sent in a script of Roxanol 20 mg/mL to [facility] per med list. Per report the patient in the active phase of dying.

## 2016-03-28 ENCOUNTER — Telehealth: Payer: Self-pay

## 2016-03-28 NOTE — Telephone Encounter (Signed)
PLEASE NOTE: All timestamps contained within this report are represented as [place] Standard Time. CONFIDENTIALTY NOTICE: This fax transmission is intended only for the addressee. It contains information that is legally privileged, confidential or otherwise protected from use or disclosure. If you are not the intended recipient, you are strictly prohibited from reviewing, disclosing, copying using or disseminating any of this information or taking any action in reliance on or regarding this information. If you have received this fax in error, please notify us immediately by telephone so that we can arrange for its return to [place]. Phone: [phone], Toll-Free: [phone], Fax: [phone] Page: 1 of 1 Call Id: [id number] [place] Night - Client Nonclinical Telephone Record [place] Night - Client Client Site [place] Physician Viviana Simpler - MD Contact Type Call Who Is Calling Physician / Provider / Hospital Call Type Provider Call [facility] Page Now Reason for Call Request to speak to Physician Initial [place], RN. PT went out at beginning of the week in hospital with UTI, dimentia, dehydration. Sent her back 01/24. This morning modeling, unresponsive, and seen by hospice. Hospice is coming there. The office has nothing for anxiety, and family is wanting her comfortable. Additional Comment Patient Name Brandy Shepard Patient DOB December 23, 1926 Requesting Provider Rip Harbour RN Physician Number [phone] Facility Name [facility] Phone DateTime Result/Outcome Message Type Notes Owens Loffler - MD [place]:[phone] 03/26/2016 11:42:39 AM Called On Call Provider - Reached Doctor Paged Owens Loffler - MD 03/26/2016 11:42:44 AM Spoke with On Call - General Message Result Call Closed By: Caryl Comes Transaction Date/Time: 03/26/2016 11:18:06 AM (ET)

## 2016-03-28 NOTE — Telephone Encounter (Signed)
Per chart review tab pt seen [facility] and admitted on 03/21/16 and discharged on 03/23/16.

## 2016-03-28 NOTE — Telephone Encounter (Signed)
She just died yesterday here---never improved after coming back to [facility]

## 2016-03-28 NOTE — Progress Notes (Signed)
She has passed now

## 2016-03-31 DEATH — deceased

## 2016-04-12 ENCOUNTER — Ambulatory Visit: Payer: Medicare Other | Admitting: Internal Medicine
